# Patient Record
Sex: Female | Born: 1990 | Race: Black or African American | Hispanic: No | Marital: Single | Smoking: Never smoker
Health system: Southern US, Community
[De-identification: ages and names within clinical notes are randomized; demographics above are authoritative.]

## PROBLEM LIST (undated history)

## (undated) DIAGNOSIS — J302 Other seasonal allergic rhinitis: Secondary | ICD-10-CM

## (undated) DIAGNOSIS — S82899A Other fracture of unspecified lower leg, initial encounter for closed fracture: Secondary | ICD-10-CM

## (undated) DIAGNOSIS — B009 Herpesviral infection, unspecified: Secondary | ICD-10-CM

## (undated) HISTORY — PX: ANKLE SURGERY: SHX546

## (undated) HISTORY — PX: MOUTH SURGERY: SHX715

---
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
## 2012-09-03 ENCOUNTER — Encounter (HOSPITAL_COMMUNITY): Payer: Self-pay | Admitting: *Deleted

## 2012-09-03 ENCOUNTER — Emergency Department (INDEPENDENT_AMBULATORY_CARE_PROVIDER_SITE_OTHER)
Admission: EM | Admit: 2012-09-03 | Discharge: 2012-09-03 | Disposition: A | Discharge status: Home / Self Care | Payer: PRIVATE HEALTH INSURANCE | Source: Home / Self Care | Attending: Emergency Medicine | Admitting: Emergency Medicine

## 2012-09-03 DIAGNOSIS — N3000 Acute cystitis without hematuria: Secondary | ICD-10-CM

## 2012-09-03 LAB — POCT URINALYSIS DIP (DEVICE)
Bilirubin Urine: NEGATIVE
Glucose, UA: NEGATIVE mg/dL

## 2012-09-03 LAB — POCT PREGNANCY, URINE: Preg Test, Ur: NEGATIVE

## 2012-09-03 MED ORDER — CEPHALEXIN 500 MG PO CAPS: 500.0000 mg | ORAL_CAPSULE | Freq: Three times a day (TID) | ORAL | Status: DC

## 2012-09-03 MED ORDER — PHENAZOPYRIDINE HCL 200 MG PO TABS: 200.0000 mg | ORAL_TABLET | Freq: Three times a day (TID) | ORAL | Status: DC | PRN

## 2012-09-03 NOTE — ED Provider Notes (Signed)
Chief Complaint  Patient presents with  . Urinary Frequency    History of Present Illness:   The patient is a 21 year old female who has had a two-day history of urinary frequency, urgency, hematuria, and lower back pain. She denies dysuria, incontinence, fever, chills, nausea, vomiting, abdominal pain, or GYN complaints. No prior history of urinary tract infections.  Review of Systems:  Other than noted above, the patient denies any of the following symptoms: General:  No fevers, chills, sweats, aches, or fatigue. GI:  No abdominal pain, back pain, nausea, vomiting, diarrhea, or constipation. GU:  No dysuria, frequency, urgency, hematuria, or incontinence. GYN:  No discharge, itching, vulvar pain or lesions, pelvic pain, or abnormal vaginal bleeding.  PMFSH:  Past medical history, family history, social history, meds, and allergies were reviewed.  Physical Exam:   Vital signs:  LMP 08/07/2012 Gen:  Alert, oriented, in no distress. Lungs:  Clear to auscultation, no wheezes, rales or rhonchi. Heart:  Regular rhythm, no gallop or murmer. Abdomen:  Flat and soft. There was slight suprapubic pain to palpation.  No guarding, or rebound.  No hepato-splenomegaly or mass.  Bowel sounds were normally active.  No hernia. Back:  No CVA tenderness.  Skin:  Clear, warm and dry.  Labs:   Results for orders placed during the hospital encounter of 09/03/12  POCT URINALYSIS DIP (DEVICE)      Component Value Range   Glucose, UA NEGATIVE  NEGATIVE mg/dL   Bilirubin Urine NEGATIVE  NEGATIVE   Ketones, ur TRACE (*) NEGATIVE mg/dL   Specific Gravity, Urine 1.025  1.005 - 1.030   Hgb urine dipstick LARGE (*) NEGATIVE   pH 5.5  5.0 - 8.0   Protein, ur >=300 (*) NEGATIVE mg/dL   Urobilinogen, UA 0.2  0.0 - 1.0 mg/dL   Nitrite POSITIVE (*) NEGATIVE   Leukocytes, UA SMALL (*) NEGATIVE  POCT PREGNANCY, URINE      Component Value Range   Preg Test, Ur NEGATIVE  NEGATIVE     Other Labs Obtained at  Urgent Care Center:  A urine culture was obtained.  Results are pending at this time and we will call about any positive results.  Assessment: The encounter diagnosis was Acute cystitis.   Plan:   1.  The following meds were prescribed:   New Prescriptions   CEPHALEXIN (KEFLEX) 500 MG CAPSULE    Take 1 capsule (500 mg total) by mouth 3 (three) times daily.   PHENAZOPYRIDINE (PYRIDIUM) 200 MG TABLET    Take 1 tablet (200 mg total) by mouth 3 (three) times daily as needed for pain.   2.  The patient was instructed in symptomatic care and handouts were given. 3.  The patient was told to return if becoming worse in any way, if no better in 3 or 4 days, and given some red flag symptoms that would indicate earlier return. 4.  The patient was told to avoid intercourse for 10 days, get extra fluids, and return for a follow up with her primary care doctor at the completion of treatment for a repeat UA and culture.     Reuben Likes, MD 09/03/12 [phone]

## 2012-09-03 NOTE — ED Notes (Signed)
Pt  Reports  Symptoms  Of  Urinary  Frequency  With  Scanty  Output  As  Well  As  Hematuria    -         Since  Last  Pm  She  denys  Any  Vaginal   Discharge

## 2012-12-23 ENCOUNTER — Encounter (HOSPITAL_COMMUNITY): Payer: Self-pay | Admitting: Adult Health

## 2012-12-23 ENCOUNTER — Emergency Department (HOSPITAL_COMMUNITY)
Admission: EM | Admit: 2012-12-23 | Discharge: 2012-12-24 | Disposition: A | Discharge status: Home / Self Care | Payer: Medicaid - Out of State | Attending: Emergency Medicine | Admitting: Emergency Medicine

## 2012-12-23 DIAGNOSIS — R112 Nausea with vomiting, unspecified: Secondary | ICD-10-CM | POA: Insufficient documentation

## 2012-12-23 DIAGNOSIS — R0789 Other chest pain: Secondary | ICD-10-CM | POA: Insufficient documentation

## 2012-12-23 DIAGNOSIS — R0602 Shortness of breath: Secondary | ICD-10-CM | POA: Insufficient documentation

## 2012-12-23 HISTORY — DX: Herpesviral infection, unspecified: B00.9

## 2012-12-23 LAB — COMPREHENSIVE METABOLIC PANEL
ALT: 9 U/L (ref 0–35)
Albumin: 4.6 g/dL (ref 3.5–5.2)
Alkaline Phosphatase: 42 U/L (ref 39–117)
BUN: 7 mg/dL (ref 6–23)
Chloride: 98 mEq/L (ref 96–112)
GFR calc Af Amer: >90 mL/min (ref >90)
Glucose, Bld: 117 mg/dL — ABNORMAL HIGH (ref 70–99)
Potassium: 3.4 mEq/L — ABNORMAL LOW (ref 3.5–5.1)
Total Bilirubin: 1.3 mg/dL — ABNORMAL HIGH (ref 0.3–1.2)

## 2012-12-23 LAB — CBC WITH DIFFERENTIAL/PLATELET
Hemoglobin: 15.5 g/dL — ABNORMAL HIGH (ref 12.0–15.0)
Lymphs Abs: 0.6 10*3/uL — ABNORMAL LOW (ref 0.7–4.0)
Monocytes Relative: 2 % — ABNORMAL LOW (ref 3–12)
Neutro Abs: 17.3 10*3/uL — ABNORMAL HIGH (ref 1.7–7.7)
Neutrophils Relative %: 95 % — ABNORMAL HIGH (ref 43–77)
RBC: 4.88 MIL/uL (ref 3.87–5.11)
WBC: 18.3 10*3/uL — ABNORMAL HIGH (ref 4.0–10.5)

## 2012-12-23 MED ORDER — ONDANSETRON 4 MG PO TBDP
8.0000 mg | ORAL_TABLET | Freq: Once | ORAL | Status: AC
  Administered 2012-12-23: 8 mg via ORAL

## 2012-12-23 MED ORDER — ONDANSETRON 4 MG PO TBDP
ORAL_TABLET | ORAL | Status: AC
  Filled 2012-12-23: qty 2

## 2012-12-23 MED ORDER — ONDANSETRON HCL 4 MG/2ML IJ SOLN
4.0000 mg | Freq: Once | INTRAMUSCULAR | Status: AC
  Administered 2012-12-23: 4 mg via INTRAVENOUS
  Filled 2012-12-23: qty 2

## 2012-12-23 MED ORDER — SODIUM CHLORIDE 0.9 % IV BOLUS (SEPSIS)
1000.0000 mL | Freq: Once | INTRAVENOUS | Status: AC
Start: 2012-12-23 — End: 2012-12-24
  Administered 2012-12-23: 1000 mL via INTRAVENOUS

## 2012-12-23 MED ORDER — ONDANSETRON 4 MG PO TBDP
4.0000 mg | ORAL_TABLET | Freq: Once | ORAL | Status: AC
  Administered 2012-12-23: 4 mg via ORAL
  Filled 2012-12-23: qty 1

## 2012-12-23 NOTE — ED Provider Notes (Signed)
History     CSN: [id number]  Arrival date & time 12/23/12  9147   First MD Initiated Contact with Patient 12/23/12 2041      Chief Complaint  Patient presents with  . Nausea    (Consider location/radiation/quality/duration/timing/severity/associated sxs/prior treatment) HPI Comments: Pt presents to the ED for vomiting x 8 hours.  States she woke up around noon and began vomiting associated with intermittent chills.  Emesis was mostly non-bloody liquid but is now mainly dry heaving.  There is some intermittent SOB and chest discomfort during retching fits.  Does not recall eating anything out of ordinary.  Pt started her menstrual cycle yesterday and notes that this happens during that time but usually not this severe.  Pt denies any abdominal pain, diarrhea, dysuria, vaginal discharge, cough, or fever.  No sick exposures.  The history is provided by the patient and a friend.    Past Medical History  Diagnosis Date  . Herpes     History reviewed. No pertinent past surgical history.  History reviewed. No pertinent family history.  History  Substance Use Topics  . Smoking status: Never Smoker   . Smokeless tobacco: Not on file  . Alcohol Use: No    OB History   Grav Para Term Preterm Abortions TAB SAB Ect Mult Living                  Review of Systems  Gastrointestinal: Positive for nausea and vomiting.  All other systems reviewed and are negative.    Allergies  Review of patient's allergies indicates no known allergies.  Home Medications  No current outpatient prescriptions on file.  BP 124/65  Pulse 71  Temp(Src) 97.8 F (36.6 C) (Oral)  Resp 16  SpO2 100%  Physical Exam  Nursing note and vitals reviewed. Constitutional: She is oriented to person, place, and time. She appears well-developed and well-nourished.  HENT:  Head: Normocephalic and atraumatic.  Mouth/Throat: Uvula is midline, oropharynx is clear and moist and mucous membranes are normal. No  oropharyngeal exudate, posterior oropharyngeal edema, posterior oropharyngeal erythema or tonsillar abscesses.  Eyes: Conjunctivae and EOM are normal.  Neck: Normal range of motion. Neck supple.  Cardiovascular: Normal rate, regular rhythm and normal heart sounds.   Pulmonary/Chest: Effort normal and breath sounds normal. She has no wheezes. She has no rhonchi.  Abdominal: Soft. Bowel sounds are normal. She exhibits no distension. There is no tenderness. There is no guarding, no CVA tenderness, no tenderness at McBurney's point and negative Murphy's sign.  Musculoskeletal: Normal range of motion.  Neurological: She is alert and oriented to person, place, and time.  Skin: Skin is warm and dry.  Psychiatric: She has a normal mood and affect.    ED Course  Procedures (including critical care time)  Labs Reviewed  CBC WITH DIFFERENTIAL - Abnormal; Notable for the following:    WBC 18.3 (*)    Hemoglobin 15.5 (*)    MCHC 36.4 (*)    Neutrophils Relative 95 (*)    Neutro Abs 17.3 (*)    Lymphocytes Relative 4 (*)    Lymphs Abs 0.6 (*)    Monocytes Relative 2 (*)    All other components within normal limits  COMPREHENSIVE METABOLIC PANEL - Abnormal; Notable for the following:    Potassium 3.4 (*)    Glucose, Bld 117 (*)    Total Bilirubin 1.3 (*)    All other components within normal limits  URINALYSIS, ROUTINE W REFLEX MICROSCOPIC - Abnormal;  Notable for the following:    APPearance CLOUDY (*)    pH 8.5 (*)    Hgb urine dipstick SMALL (*)    Protein, ur 100 (*)    All other components within normal limits  URINE MICROSCOPIC-ADD ON - Abnormal; Notable for the following:    Squamous Epithelial / LPF FEW (*)    All other components within normal limits  URINE CULTURE  LIPASE, BLOOD  POCT PREGNANCY, URINE   No results found.   1. Nausea & vomiting       MDM   22 y.o. Female presenting to the ED for nausea and vomiting x 8 hours.  States she woke up around noon and began  vomiting associated with intermittent chills.  Emesis was mostly non-bloody liquid but is now mainly dry heaving.  Pt has hx of these symptoms around the time of her menses, which began yesterday, 3/17.  u-preg negative.  Urine culture pending.    Leukocytosis at 18.3 but pt is without any abdominal pain- will defer CT at this time.  Pt tolerating PO fluids and crackers without difficulty prior to d/c.  Rx zofran PRN nausea.  Return precautions advised.       Garlon Hatchet, PA-C 12/24/12 [phone]

## 2012-12-23 NOTE — ED Notes (Signed)
Pt st's she is nauseated and vomited.  Zofran ODT given for same

## 2012-12-23 NOTE — ED Notes (Signed)
Pt st's she feels better after fluids and zofran

## 2012-12-23 NOTE — ED Notes (Signed)
Resents with onset of nausea, abdominal pain and vomiting that began at 12pm today. Currently menstrating, abdominal pain is generalized. Reports 20 times of emesis, denies diarhhea. C/o SOB, chest pain after vomiting.

## 2012-12-23 NOTE — ED Notes (Signed)
Pt st's she has had nausea and vomiting since this am.  Pt denies diarrhea.

## 2012-12-24 LAB — URINALYSIS, ROUTINE W REFLEX MICROSCOPIC
Bilirubin Urine: NEGATIVE
Glucose, UA: NEGATIVE mg/dL
Ketones, ur: NEGATIVE mg/dL
Leukocytes, UA: NEGATIVE
Protein, ur: 100 mg/dL — AB

## 2012-12-24 LAB — URINE MICROSCOPIC-ADD ON

## 2012-12-24 MED ORDER — ONDANSETRON 4 MG PO TBDP: 4.0000 mg | ORAL_TABLET | Freq: Three times a day (TID) | ORAL | Status: DC | PRN

## 2012-12-25 LAB — URINE CULTURE: Colony Count: 85000

## 2012-12-25 NOTE — ED Provider Notes (Signed)
Medical screening examination/treatment/procedure(s) were performed by non-physician practitioner and as supervising physician I was immediately available for consultation/collaboration. Patient with nausea vomiting. She does have leukocytosis. She is nontender. She's tolerated orals and feels better and will be discharged home with return precautions  Juliet Rude. Rubin Payor, MD 12/25/12 0000

## 2013-03-10 ENCOUNTER — Encounter (HOSPITAL_COMMUNITY): Payer: Self-pay | Admitting: *Deleted

## 2013-03-10 ENCOUNTER — Emergency Department (HOSPITAL_COMMUNITY)
Admission: EM | Admit: 2013-03-10 | Discharge: 2013-03-11 | Disposition: A | Discharge status: Home / Self Care | Payer: Medicaid - Out of State | Attending: Emergency Medicine | Admitting: Emergency Medicine

## 2013-03-10 DIAGNOSIS — R1032 Left lower quadrant pain: Secondary | ICD-10-CM | POA: Insufficient documentation

## 2013-03-10 DIAGNOSIS — N949 Unspecified condition associated with female genital organs and menstrual cycle: Secondary | ICD-10-CM | POA: Insufficient documentation

## 2013-03-10 DIAGNOSIS — R112 Nausea with vomiting, unspecified: Secondary | ICD-10-CM

## 2013-03-10 DIAGNOSIS — R0602 Shortness of breath: Secondary | ICD-10-CM | POA: Insufficient documentation

## 2013-03-10 DIAGNOSIS — N898 Other specified noninflammatory disorders of vagina: Secondary | ICD-10-CM | POA: Insufficient documentation

## 2013-03-10 DIAGNOSIS — M545 Low back pain, unspecified: Secondary | ICD-10-CM | POA: Insufficient documentation

## 2013-03-10 DIAGNOSIS — Z8619 Personal history of other infectious and parasitic diseases: Secondary | ICD-10-CM | POA: Insufficient documentation

## 2013-03-10 DIAGNOSIS — R109 Unspecified abdominal pain: Secondary | ICD-10-CM

## 2013-03-10 DIAGNOSIS — R0789 Other chest pain: Secondary | ICD-10-CM | POA: Insufficient documentation

## 2013-03-10 NOTE — ED Notes (Signed)
Pt in c/o LLQ abd pain with n/v x2 hours

## 2013-03-11 ENCOUNTER — Emergency Department (HOSPITAL_COMMUNITY): Payer: Medicaid - Out of State

## 2013-03-11 LAB — COMPREHENSIVE METABOLIC PANEL
ALT: 6 U/L (ref 0–35)
AST: 21 U/L (ref 0–37)
Albumin: 4.9 g/dL (ref 3.5–5.2)
Alkaline Phosphatase: 42 U/L (ref 39–117)
BUN: 9 mg/dL (ref 6–23)
CO2: 25 mEq/L (ref 19–32)
Calcium: 10.6 mg/dL — ABNORMAL HIGH (ref 8.4–10.5)
Chloride: 94 mEq/L — ABNORMAL LOW (ref 96–112)
Creatinine, Ser: 0.73 mg/dL (ref 0.50–1.10)
GFR calc Af Amer: >90 mL/min (ref >90)
GFR calc non Af Amer: >90 mL/min (ref >90)
Glucose, Bld: 142 mg/dL — ABNORMAL HIGH (ref 70–99)
Potassium: 4.2 mEq/L (ref 3.5–5.1)
Sodium: 135 mEq/L (ref 135–145)
Total Bilirubin: 1.3 mg/dL — ABNORMAL HIGH (ref 0.3–1.2)
Total Protein: 8.7 g/dL — ABNORMAL HIGH (ref 6.0–8.3)

## 2013-03-11 LAB — CBC WITH DIFFERENTIAL/PLATELET
Basophils Absolute: 0 10*3/uL (ref 0.0–0.1)
Basophils Relative: 0 % (ref 0–1)
Eosinophils Absolute: 0 10*3/uL (ref 0.0–0.7)
Eosinophils Relative: 0 % (ref 0–5)
HCT: 44.4 % (ref 36.0–46.0)
Hemoglobin: 15.2 g/dL — ABNORMAL HIGH (ref 12.0–15.0)
Lymphocytes Relative: 6 % — ABNORMAL LOW (ref 12–46)
Lymphs Abs: 0.9 10*3/uL (ref 0.7–4.0)
MCH: 30.2 pg (ref 26.0–34.0)
MCHC: 34.2 g/dL (ref 30.0–36.0)
MCV: 88.1 fL (ref 78.0–100.0)
Monocytes Absolute: 0.3 10*3/uL (ref 0.1–1.0)
Monocytes Relative: 2 % — ABNORMAL LOW (ref 3–12)
Neutro Abs: 14.6 10*3/uL — ABNORMAL HIGH (ref 1.7–7.7)
Neutrophils Relative %: 92 % — ABNORMAL HIGH (ref 43–77)
Platelets: 195 10*3/uL (ref 150–400)
RBC: 5.04 MIL/uL (ref 3.87–5.11)
RDW: 11.8 % (ref 11.5–15.5)
WBC: 15.8 10*3/uL — ABNORMAL HIGH (ref 4.0–10.5)

## 2013-03-11 LAB — WET PREP, GENITAL
Clue Cells Wet Prep HPF POC: NONE SEEN
Trich, Wet Prep: NONE SEEN

## 2013-03-11 LAB — URINE MICROSCOPIC-ADD ON

## 2013-03-11 LAB — URINALYSIS, ROUTINE W REFLEX MICROSCOPIC
Bilirubin Urine: NEGATIVE
Hgb urine dipstick: NEGATIVE
Ketones, ur: 40 mg/dL — AB
Specific Gravity, Urine: 1.025 (ref 1.005–1.030)
pH: 8.5 — ABNORMAL HIGH (ref 5.0–8.0)

## 2013-03-11 LAB — GC/CHLAMYDIA PROBE AMP
CT Probe RNA: NEGATIVE
GC Probe RNA: NEGATIVE

## 2013-03-11 LAB — POCT I-STAT TROPONIN I: Troponin i, poc: 0 ng/mL (ref 0.00–0.08)

## 2013-03-11 LAB — RAPID URINE DRUG SCREEN, HOSP PERFORMED: Barbiturates: NOT DETECTED

## 2013-03-11 MED ORDER — ONDANSETRON HCL 4 MG/2ML IJ SOLN
4.0000 mg | Freq: Once | INTRAMUSCULAR | Status: AC
  Administered 2013-03-11: 4 mg via INTRAVENOUS
  Filled 2013-03-11: qty 2

## 2013-03-11 MED ORDER — SODIUM CHLORIDE 0.9 % IV BOLUS (SEPSIS)
1000.0000 mL | Freq: Once | INTRAVENOUS | Status: AC
  Administered 2013-03-11: 1000 mL via INTRAVENOUS

## 2013-03-11 MED ORDER — LORAZEPAM 1 MG PO TABS
1.0000 mg | ORAL_TABLET | Freq: Once | ORAL | Status: AC
  Administered 2013-03-11: 1 mg via ORAL
  Filled 2013-03-11: qty 1

## 2013-03-11 MED ORDER — LORAZEPAM 2 MG/ML IJ SOLN
0.5000 mg | Freq: Once | INTRAMUSCULAR | Status: AC
  Administered 2013-03-11: 0.5 mg via INTRAVENOUS
  Filled 2013-03-11: qty 1

## 2013-03-11 NOTE — ED Provider Notes (Signed)
History     CSN: [id number]  Arrival date & time 03/10/13  2215   First MD Initiated Contact with Patient 03/11/13 0013      Chief Complaint  Patient presents with  . Abdominal Pain    (Consider location/radiation/quality/duration/timing/severity/associated sxs/prior treatment) The history is provided by the patient and a friend. No language interpreter was used.  Meagan Anderson is a 22 y/o F with PMHx of Herpes presenting to the ED, with girlfriend, with sudden onset of LLQ pain that started at approximately 6:30PM while her and her partner were having sexual interactions, described as a sharp, shooting discomfort with radiation to the lower back, constant. Patient reported that she is experiencing lower back pain described as a constant cramping sensation. Reported that she has been feeling nauseous ever since 6:30PM - reported that she vomited at least 10 times today, mainly of food first and then liquids. Patient reported that she has been experiencing chest pain, described as a burning sensation to the center of her chest that has been ongoing starting since she has been vomiting, reported that she finds it difficult to breath. Reported that she immediately came to the ED with the onset of the LLQ pain. Associated symptoms are chills. Denied vaginal pain, vaginal discharge, fever, headache, dizziness, blurred vision, neck pain, palpitations.      Last outbreak of genital herpes was last week - denied being on medication - stated that she gets outbreaks at least once per month - denied follow-up.   Past Medical History  Diagnosis Date  . Herpes     History reviewed. No pertinent past surgical history.  History reviewed. No pertinent family history.  History  Substance Use Topics  . Smoking status: Never Smoker   . Smokeless tobacco: Not on file  . Alcohol Use: No    OB History   Grav Para Term Preterm Abortions TAB SAB Ect Mult Living                  Review of Systems    Constitutional: Negative for fever, chills and fatigue.  HENT: Negative for sore throat, trouble swallowing, neck pain and neck stiffness.   Eyes: Negative for photophobia, pain and visual disturbance.  Respiratory: Positive for chest tightness and shortness of breath. Negative for cough.   Cardiovascular: Negative for chest pain.  Gastrointestinal: Positive for nausea, vomiting and abdominal pain. Negative for diarrhea, constipation, blood in stool and anal bleeding.  Genitourinary: Positive for pelvic pain. Negative for dysuria, decreased urine volume, vaginal bleeding, vaginal discharge, difficulty urinating and vaginal pain.  Musculoskeletal: Negative for back pain and arthralgias.  Skin: Negative for rash and wound.  Neurological: Negative for dizziness, weakness, light-headedness, numbness and headaches.  All other systems reviewed and are negative.    Allergies  Review of patient's allergies indicates no known allergies.  Home Medications  No current outpatient prescriptions on file.  BP 122/74  Pulse 70  Temp(Src) 98 F (36.7 C) (Oral)  Resp 20  Ht 5\' 6"  (1.676 m)  Wt 130 lb (58.968 kg)  BMI 20.99 kg/m2  SpO2 100%  Physical Exam  Nursing note and vitals reviewed. Constitutional: She is oriented to person, place, and time. She appears well-developed and well-nourished. No distress.  HENT:  Head: Normocephalic and atraumatic.  Eyes: Conjunctivae and EOM are normal. Pupils are equal, round, and reactive to light. Right eye exhibits no discharge. Left eye exhibits no discharge.  Neck: Normal range of motion. Neck supple. No tracheal  deviation present.  Cardiovascular: Normal rate, regular rhythm and normal heart sounds.  Exam reveals no friction rub.   No murmur heard. Pulses:      Radial pulses are 2+ on the right side, and 2+ on the left side.  Pulmonary/Chest: Effort normal and breath sounds normal. No respiratory distress. She has no wheezes. She has no rales. She  exhibits no tenderness.  Negative chest wall tenderness  Abdominal: Soft. Bowel sounds are normal. She exhibits no distension. There is tenderness. There is no rebound and no guarding.    Genitourinary: Vaginal discharge found.  Speculum: Negative lesions, sores, inflammation, swelling, erythema, noted to external genitalia. Negative bleeding in vaginal vault. Thick white discharge noted from the cervical os. Negative inflammation, swelling, lesions, sores noted to the vaginal canal. Negative inflammation, swelling, "strawberry" appearance to the cervix.  Pelvic: Negative masses, lesions, sores palpated. Negative CMT. Mild discomfort on the left adnexa.   Lymphadenopathy:    She has no cervical adenopathy.  Neurological: She is alert and oriented to person, place, and time. No cranial nerve deficit. She exhibits normal muscle tone. Coordination normal.  Skin: Skin is warm and dry. No rash noted. She is not diaphoretic. No erythema.  Psychiatric: She has a normal mood and affect. Her behavior is normal. Thought content normal.    ED Course  Procedures (including critical care time)  Labs Reviewed  CBC WITH DIFFERENTIAL - Abnormal; Notable for the following:    WBC 15.8 (*)    Hemoglobin 15.2 (*)    Neutrophils Relative % 92 (*)    Neutro Abs 14.6 (*)    Lymphocytes Relative 6 (*)    Monocytes Relative 2 (*)    All other components within normal limits  COMPREHENSIVE METABOLIC PANEL - Abnormal; Notable for the following:    Chloride 94 (*)    Glucose, Bld 142 (*)    Calcium 10.6 (*)    Total Protein 8.7 (*)    Total Bilirubin 1.3 (*)    All other components within normal limits   No results found.   No diagnosis found.    MDM  Blood-work and urine analysis ordered. Wet prep and GC/Chlamydia probe ordered. US abdomen ordered to rule out possible ovarian cyst. Possible CT scan of abdomen to find source of abdominal discomfort if [place] negative. Patient given IV NS fluids and  antiemetics for nausea and vomiting control. Control patient's nausea and vomiting. Control chest pain of patient.  Discussed case with Dr. Marylen Ponto and Earley Favor, NP - transfer of care to Earley Favor, NP at 2:00AM.         Raymon Mutton, PA-C 03/11/13 [phone]

## 2013-03-11 NOTE — ED Notes (Signed)
Patient given a cup and is aware that we need a sample

## 2013-03-11 NOTE — ED Provider Notes (Signed)
  Physical Exam  BP 117/60  Pulse 74  Temp(Src) 98.2 F (36.8 C) (Oral)  Resp 18  Ht 5\' 6"  (1.676 m)  Wt 130 lb (58.968 kg)  BMI 20.99 kg/m2  SpO2 100%  Physical Exam Patient is awaiting ultrasound, and Ativan for symptom control ED Course  Procedures  MDM She's had no episodes of nausea, or vomiting.  In the past 2 hours since being given Ativan, abdominal pain, has since resolved.  Her ultrasound has been reviewed and is negative.  She will be discharged him      Arman Filter, NP 03/11/13 [phone]

## 2013-03-12 NOTE — ED Provider Notes (Signed)
Medical screening examination/treatment/procedure(s) were performed by non-physician practitioner and as supervising physician I was immediately available for consultation/collaboration.  Raeford Razor, MD 03/12/13 [phone]

## 2013-03-12 NOTE — ED Provider Notes (Signed)
Medical screening examination/treatment/procedure(s) were performed by non-physician practitioner and as supervising physician I was immediately available for consultation/collaboration.  Raeford Razor, MD 03/12/13 [phone]

## 2013-10-20 ENCOUNTER — Emergency Department (INDEPENDENT_AMBULATORY_CARE_PROVIDER_SITE_OTHER)
Admission: EM | Admit: 2013-10-20 | Discharge: 2013-10-20 | Disposition: A | Discharge status: Home / Self Care | Payer: Self-pay | Source: Home / Self Care | Attending: Family Medicine | Admitting: Family Medicine

## 2013-10-20 ENCOUNTER — Encounter (HOSPITAL_COMMUNITY): Payer: Self-pay | Admitting: Emergency Medicine

## 2013-10-20 DIAGNOSIS — M545 Low back pain, unspecified: Secondary | ICD-10-CM

## 2013-10-20 HISTORY — DX: Other seasonal allergic rhinitis: J30.2

## 2013-10-20 MED ORDER — CYCLOBENZAPRINE HCL 5 MG PO TABS: 5.0000 mg | ORAL_TABLET | Freq: Every evening | ORAL | Status: DC | PRN

## 2013-10-20 MED ORDER — DICLOFENAC SODIUM 50 MG PO TBEC: 50.0000 mg | DELAYED_RELEASE_TABLET | Freq: Two times a day (BID) | ORAL | Status: DC | PRN

## 2013-10-20 MED ORDER — TRAMADOL HCL 50 MG PO TABS: 50.0000 mg | ORAL_TABLET | Freq: Four times a day (QID) | ORAL | Status: DC | PRN

## 2013-10-20 NOTE — ED Provider Notes (Signed)
Meagan Anderson is a 23 y.o. female who presents to Urgent Care today for left low back pain radiating to the left knee for the last 2-3 days. This occurred without injury. Pain is moderate to severe and is worse with activity. No weakness numbness bowel bladder dysfunction or difficulty walking. Patient has tried a heating pad which did not help much. She has not tried any medications yet. No fevers or chills nausea vomiting or diarrhea. She has never had a like this happen before.   Past Medical History  Diagnosis Date  . Herpes   . Seasonal allergies    History  Substance Use Topics  . Smoking status: Never Smoker   . Smokeless tobacco: Not on file  . Alcohol Use: No   ROS as above Medications reviewed. No current facility-administered medications for this encounter.   Current Outpatient Prescriptions  Medication Sig Dispense Refill  . cyclobenzaprine (FLEXERIL) 5 MG tablet Take 1 tablet (5 mg total) by mouth at bedtime as needed for muscle spasms.  20 tablet  0  . diclofenac (VOLTAREN) 50 MG EC tablet Take 1 tablet (50 mg total) by mouth 2 (two) times daily as needed.  60 tablet  0  . traMADol (ULTRAM) 50 MG tablet Take 1 tablet (50 mg total) by mouth every 6 (six) hours as needed.  15 tablet  0    Exam:  BP 138/86  Pulse 82  Temp(Src) 98.8 F (37.1 C) (Oral)  Resp 16  SpO2 100%  LMP 09/18/2013 Gen: Well NAD BACK: Nontender to spinal midline. Tender to palpation left lumbar paraspinal and SI joint. Negative straight leg raise test and negative Faber test bilaterally. Hip motion is intact bilaterally. Sensation is intact throughout bilateral lower extremities. Strength is intact throughout bilaterally extremities. Patient is in her toes heels squat get on and off exam table and has a normal gait. Reflexes are equal and intact bilateral knees and ankles.   Assessment and Plan: 23 y.o. female with lumbago due to myofascial injury of the left low back. Do not think patient  has sciatica as the pain does not radiate below the knee. Plan to treat with diclofenac Flexeril and tramadol. Additionally use a heating pad and home exercise program. Work note provided.  Discussed warning signs or symptoms. Please see discharge instructions. Patient expresses understanding.    Rodolph BongEvan S Ewel Lona, MD 10/20/13 Ernestina [id number]

## 2013-10-20 NOTE — ED Notes (Signed)
C/o low back pain onset Sunday.  No known injury.  Pain sometimes radiates up her back or down her L leg. Unable to bend or twist.  After sitting, her back feels locked when she gets up and then can walk. Pain going up hills or curb causes the pain.

## 2013-10-20 NOTE — Discharge Instructions (Signed)
Thank you for coming in today. Take Diclofenac twice daily. Use Flexeril at bedtime as needed. Use tramadol for severe pain as needed. Use a heating pad. Come back or go to the emergency room if you notice new weakness new numbness problems walking or bowel or bladder problems.

## 2014-10-08 NOTE — L&D Delivery Note (Cosign Needed)
Delivery Note Meagan Anderson, CNM came to [facility] when pt could be heard screaming and pushing involuntarily. Vertex was 10/10/+3. At 1306 a viable and healthy female was delivered precipitously via  (Presentation:LOA w/ right compound arm).  APGAR: 8, 9; weight pending.  Meagan Anderson, CNM arrived after delivery of baby and took over care of pt.   Meagan Anderson 07/06/2015, 1:21 PM   Delivery Note Infant on maternal abdomen after delivery.  Delayed cord clamping for 3-5 minutes.  Cord double clamped and cut.  Cord blood labs sent.    Placenta status: spontaneous, schultz, intact; 3 vessel cord.  Cord pH: N/A.  Uterus firm, bleeding stable.    Anesthesia:  none Episiotomy:  None Lacerations:  None Suture Repair: None Est. Blood Loss (mL):  5 ml  Mom to postpartum.  Baby to Couplet care / Skin to Skin.  Roe Coombs, CNM 07/06/2015, 1:32 PM

## 2015-01-24 ENCOUNTER — Telehealth: Payer: Self-pay

## 2015-01-24 NOTE — Telephone Encounter (Signed)
Left message regarding patient's pmt plan on 01/24/15 - see notes in guarantor snapshot and message note in appt date - has appt 02/01/15

## 2015-02-01 ENCOUNTER — Encounter: Payer: Self-pay | Admitting: Certified Nurse Midwife

## 2015-02-01 ENCOUNTER — Ambulatory Visit (INDEPENDENT_AMBULATORY_CARE_PROVIDER_SITE_OTHER): Payer: Managed Care, Other (non HMO) | Admitting: Certified Nurse Midwife

## 2015-02-01 VITALS — BP 137/75 | HR 90 | Temp 98.9°F | Wt 133.0 lb

## 2015-02-01 DIAGNOSIS — Z3402 Encounter for supervision of normal first pregnancy, second trimester: Secondary | ICD-10-CM

## 2015-02-01 DIAGNOSIS — O2690 Pregnancy related conditions, unspecified, unspecified trimester: Secondary | ICD-10-CM | POA: Diagnosis not present

## 2015-02-01 DIAGNOSIS — Z8619 Personal history of other infectious and parasitic diseases: Secondary | ICD-10-CM

## 2015-02-01 LAB — POCT URINALYSIS DIPSTICK
GLUCOSE UA: NEGATIVE
KETONES UA: 2
Leukocytes, UA: NEGATIVE
Nitrite, UA: NEGATIVE
PH UA: 7
RBC UA: NEGATIVE
SPEC GRAV UA: 1.015

## 2015-02-01 LAB — TSH: TSH: 1.128 u[IU]/mL (ref 0.350–4.500)

## 2015-02-01 MED ORDER — OB COMPLETE GOLD 27.5-1-200 MG PO CAPS: 1.0000 | ORAL_CAPSULE | Freq: Every day | ORAL | Status: DC
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
## 2015-02-01 NOTE — Progress Notes (Signed)
Patient is experiencing her first pregnancy.

## 2015-02-02 LAB — OBSTETRIC PANEL
ANTIBODY SCREEN: NEGATIVE
BASOS ABS: 0 10*3/uL (ref 0.0–0.1)
Basophils Relative: 0 % (ref 0–1)
Eosinophils Absolute: 0 10*3/uL (ref 0.0–0.7)
Eosinophils Relative: 0 % (ref 0–5)
HEMATOCRIT: 41.7 % (ref 36.0–46.0)
Hemoglobin: 14.2 g/dL (ref 12.0–15.0)
Hepatitis B Surface Ag: NEGATIVE
Lymphocytes Relative: 14 % (ref 12–46)
Lymphs Abs: 1.8 10*3/uL (ref 0.7–4.0)
MCH: 31.9 pg (ref 26.0–34.0)
MCHC: 34.1 g/dL (ref 30.0–36.0)
MCV: 93.7 fL (ref 78.0–100.0)
MONO ABS: 0.5 10*3/uL (ref 0.1–1.0)
MONOS PCT: 4 % (ref 3–12)
MPV: 11.5 fL (ref 8.6–12.4)
NEUTROS ABS: 10.4 10*3/uL — AB (ref 1.7–7.7)
NEUTROS PCT: 82 % — AB (ref 43–77)
Platelets: 193 10*3/uL (ref 150–400)
RBC: 4.45 MIL/uL (ref 3.87–5.11)
RDW: 13.4 % (ref 11.5–15.5)
Rh Type: NEGATIVE
Rubella: 1.06 Index — ABNORMAL HIGH (ref <0.90)
WBC: 12.7 10*3/uL — AB (ref 4.0–10.5)

## 2015-02-02 LAB — HIV ANTIBODY (ROUTINE TESTING W REFLEX): HIV: NONREACTIVE

## 2015-02-02 LAB — VITAMIN D 25 HYDROXY (VIT D DEFICIENCY, FRACTURES): VIT D 25 HYDROXY: 18 ng/mL — AB (ref 30–100)

## 2015-02-02 LAB — PAP IG W/ RFLX HPV ASCU

## 2015-02-02 LAB — VARICELLA ZOSTER ANTIBODY, IGG: Varicella IgG: 1280 Index — ABNORMAL HIGH (ref <135.00)

## 2015-02-03 LAB — AFP, QUAD SCREEN
AFP: 31.3 ng/mL
CURR GEST AGE: 15.5 wks.days
Down Syndrome Scr Risk Est: 1:3940 {titer}
HCG, Total: 27.26 IU/mL
INH: 295 pg/mL
INTERPRETATION-AFP: NEGATIVE
MOM FOR HCG: 0.54
MoM for AFP: 0.8
MoM for INH: 1.58
Open Spina bifida: NEGATIVE
TRI 18 SCR RISK EST: NEGATIVE
UE3 VALUE: 0.8 ng/mL
uE3 Mom: 1.01

## 2015-02-03 LAB — HEMOGLOBINOPATHY EVALUATION
HEMOGLOBIN OTHER: 0 %
Hgb A2 Quant: 2.8 % (ref 2.2–3.2)
Hgb A: 97 % (ref 96.8–97.8)
Hgb F Quant: 0.2 % (ref 0.0–2.0)
Hgb S Quant: 0 %

## 2015-02-03 LAB — CULTURE, OB URINE
Colony Count: NO GROWTH
ORGANISM ID, BACTERIA: NO GROWTH

## 2015-02-04 ENCOUNTER — Other Ambulatory Visit: Payer: Self-pay | Admitting: Certified Nurse Midwife

## 2015-02-04 LAB — SURESWAB, VAGINOSIS/VAGINITIS PLUS
Atopobium vaginae: 7.2 Log (cells/mL)
BV CATEGORY: UNDETERMINED — AB
C. GLABRATA, DNA: NOT DETECTED
C. TRACHOMATIS RNA, TMA: NOT DETECTED
C. albicans, DNA: NOT DETECTED
C. parapsilosis, DNA: NOT DETECTED
C. tropicalis, DNA: NOT DETECTED
LACTOBACILLUS SPECIES: 7.9 Log (cells/mL)
MEGASPHAERA SPECIES: >8 Log (cells/mL)
N. gonorrhoeae RNA, TMA: NOT DETECTED
T. VAGINALIS RNA, QL TMA: NOT DETECTED

## 2015-03-01 ENCOUNTER — Encounter: Payer: Managed Care, Other (non HMO) | Admitting: Certified Nurse Midwife

## 2015-03-01 ENCOUNTER — Other Ambulatory Visit: Payer: Managed Care, Other (non HMO)

## 2015-03-03 ENCOUNTER — Ambulatory Visit (INDEPENDENT_AMBULATORY_CARE_PROVIDER_SITE_OTHER): Payer: Managed Care, Other (non HMO) | Admitting: Certified Nurse Midwife

## 2015-03-03 ENCOUNTER — Ambulatory Visit (INDEPENDENT_AMBULATORY_CARE_PROVIDER_SITE_OTHER): Payer: Managed Care, Other (non HMO)

## 2015-03-03 VITALS — BP 136/80 | HR 80 | Temp 97.9°F | Wt 140.0 lb

## 2015-03-03 DIAGNOSIS — Z3402 Encounter for supervision of normal first pregnancy, second trimester: Secondary | ICD-10-CM | POA: Diagnosis not present

## 2015-03-03 LAB — US OB COMP + 14 WK

## 2015-03-03 LAB — POCT URINALYSIS DIPSTICK
Bilirubin, UA: NEGATIVE
Glucose, UA: NEGATIVE
KETONES UA: NEGATIVE
Leukocytes, UA: NEGATIVE
NITRITE UA: NEGATIVE
PROTEIN UA: NEGATIVE
RBC UA: NEGATIVE
SPEC GRAV UA: 1.015
Urobilinogen, UA: NEGATIVE
pH, UA: 5

## 2015-03-03 NOTE — Progress Notes (Signed)
Subjective:    Meagan Anderson is a 24 y.o. female being seen today for her obstetrical visit. She is at 523w0d gestation. Patient reports: no complaints . Fetal movement: normal.  FOB present for exam today.    Problem List Items Addressed This Visit    None    Visit Diagnoses    Encounter for supervision of normal first pregnancy in second trimester    -  Primary    Relevant Orders    POCT urinalysis dipstick      Patient Active Problem List   Diagnosis Date Noted  . Hx of herpes simplex type 2 infection 02/01/2015   Objective:    BP 136/80 mmHg  Pulse 80  Temp(Src) 97.9 F (36.6 C)  Wt 140 lb (63.504 kg)  LMP 10/14/2014 FHT: 152 BPM  Uterine Size: size equals dates and at U.      Assessment:    Pregnancy @ 3123w0d    Plan:    OBGCT: discussed. Signs and symptoms of preterm labor: discussed. Had fetal anatomy scan today.   Labs, problem list reviewed and updated 2 hr GTT planned for 28 weeks.   Follow up in 4 weeks.

## 2015-04-05 ENCOUNTER — Encounter: Payer: Self-pay | Admitting: Certified Nurse Midwife

## 2015-04-05 ENCOUNTER — Ambulatory Visit (INDEPENDENT_AMBULATORY_CARE_PROVIDER_SITE_OTHER): Payer: Managed Care, Other (non HMO) | Admitting: Certified Nurse Midwife

## 2015-04-05 VITALS — BP 128/74 | HR 95 | Temp 97.7°F | Wt 143.0 lb

## 2015-04-05 DIAGNOSIS — Z3402 Encounter for supervision of normal first pregnancy, second trimester: Secondary | ICD-10-CM

## 2015-04-05 LAB — POCT URINALYSIS DIPSTICK
Bilirubin, UA: NEGATIVE
Glucose, UA: NORMAL
Ketones, UA: NEGATIVE
Leukocytes, UA: NEGATIVE
NITRITE UA: NEGATIVE
PH UA: 5
PROTEIN UA: NEGATIVE
RBC UA: NEGATIVE
Spec Grav, UA: 1.02
Urobilinogen, UA: NEGATIVE

## 2015-04-05 NOTE — Progress Notes (Signed)
Subjective:    Meagan Anderson is a 24 y.o. female being seen today for her obstetrical visit. She is at 8057w5d gestation. Patient reports: no complaints . Fetal movement: normal.  Problem List Items Addressed This Visit    None    Visit Diagnoses    Encounter for supervision of normal first pregnancy in second trimester    -  Primary    Relevant Orders    POCT urinalysis dipstick      Patient Active Problem List   Diagnosis Date Noted  . Hx of herpes simplex type 2 infection 02/01/2015   Objective:    BP 128/74 mmHg  Pulse 95  Temp(Src) 97.7 F (36.5 C)  Wt 143 lb (64.864 kg)  LMP 10/14/2014 FHT: 145 BPM  Uterine Size: size equals dates     Assessment:    Pregnancy @ 3957w5d    Plan:    OBGCT: discussed. Signs and symptoms of preterm labor: discussed.  Labs, problem list reviewed and updated 2 hr GTT planned for next visit.  Follow up in 3 weeks, 2 days with Rhogam and 2 hour OGTT.

## 2015-04-12 ENCOUNTER — Other Ambulatory Visit: Payer: Self-pay | Admitting: Certified Nurse Midwife

## 2015-04-29 ENCOUNTER — Other Ambulatory Visit: Payer: Managed Care, Other (non HMO)

## 2015-04-29 ENCOUNTER — Ambulatory Visit (INDEPENDENT_AMBULATORY_CARE_PROVIDER_SITE_OTHER): Payer: Managed Care, Other (non HMO) | Admitting: Certified Nurse Midwife

## 2015-04-29 VITALS — BP 129/70 | HR 88 | Temp 97.9°F | Wt 146.0 lb

## 2015-04-29 DIAGNOSIS — Z3483 Encounter for supervision of other normal pregnancy, third trimester: Secondary | ICD-10-CM | POA: Diagnosis not present

## 2015-04-29 DIAGNOSIS — Z2913 Encounter for prophylactic Rho(D) immune globulin: Secondary | ICD-10-CM

## 2015-04-29 DIAGNOSIS — Z418 Encounter for other procedures for purposes other than remedying health state: Secondary | ICD-10-CM

## 2015-04-29 LAB — POCT URINALYSIS DIPSTICK
Bilirubin, UA: NEGATIVE
GLUCOSE UA: NEGATIVE
Ketones, UA: NEGATIVE
Leukocytes, UA: NEGATIVE
Nitrite, UA: NEGATIVE
PH UA: 6
Protein, UA: NEGATIVE
RBC UA: NEGATIVE
Spec Grav, UA: 1.015
Urobilinogen, UA: NEGATIVE

## 2015-04-29 LAB — CBC
HEMATOCRIT: 38.7 % (ref 36.0–46.0)
Hemoglobin: 12.8 g/dL (ref 12.0–15.0)
MCH: 31.1 pg (ref 26.0–34.0)
MCHC: 33.1 g/dL (ref 30.0–36.0)
MCV: 94.2 fL (ref 78.0–100.0)
MPV: 11.6 fL (ref 8.6–12.4)
PLATELETS: 147 10*3/uL — AB (ref 150–400)
RBC: 4.11 MIL/uL (ref 3.87–5.11)
RDW: 12.4 % (ref 11.5–15.5)
WBC: 10.9 10*3/uL — ABNORMAL HIGH (ref 4.0–10.5)

## 2015-04-29 NOTE — Progress Notes (Signed)
Subjective:    Meagan Anderson is a 24 y.o. female being seen today for her obstetrical visit. She is at [redacted]w[redacted]d gestation. Patient reports no complaints. Fetal movement: normal.  Problem List Items Addressed This Visit    None    Visit Diagnoses    Supervision of other normal pregnancy, antepartum, third trimester    -  Primary    Relevant Orders    US OB Follow Up    POCT urinalysis dipstick (Completed)    Glucose Tolerance, 2 Hours w/1 Hour    CBC    HIV antibody    RPR    Need for rhogam due to Rh negative mother        Relevant Orders    AMB referral to maternal fetal medicine      Patient Active Problem List   Diagnosis Date Noted  . Hx of herpes simplex type 2 infection 02/01/2015   Objective:    BP 129/70 mmHg  Pulse 88  Temp(Src) 97.9 F (36.6 C)  Wt 146 lb (66.225 kg)  LMP 10/14/2014 FHT:  135 BPM  Uterine Size: size equals dates  Presentation: unsure     Assessment:    Pregnancy @ [redacted]w[redacted]d weeks   Patient refused Rhogam  Plan:    F/U ultrasound for sub-obtimal views with fetal anamtomy   labs reviewed, problem list updated TOC for Trich on previous sure swab 2 hour OGTT in office today  Consent signed. TDAP offered  Rhogam refused in office, MFM consult done. Patient given information about Rhogam.   Pediatrician: discussed. Infant feeding: plans to breastfeed. Maternity leave: discussed. Cigarette smoking: never smoked. Orders Placed This Encounter  Procedures  . US OB Follow Up    Standing Status: Future     Number of Occurrences:      Standing Expiration Date: 06/29/2016    Order Specific Question:  Reason for Exam (SYMPTOM  OR DIAGNOSIS REQUIRED)    Answer:  for suboptimal views on previous US    Order Specific Question:  Preferred imaging location?    Answer:  [facility]  . Glucose Tolerance, 2 Hours w/1 Hour  . CBC  . HIV antibody  . RPR  . AMB referral to maternal fetal medicine    Referral Priority:  Routine    Referral Type:   Consultation    Number of Visits Requested:  1  . POCT urinalysis dipstick   No orders of the defined types were placed in this encounter.   Follow up in 2 Weeks.

## 2015-04-30 LAB — GLUCOSE TOLERANCE, 2 HOURS W/ 1HR
GLUCOSE, 2 HOUR: 88 mg/dL (ref 70–139)
GLUCOSE, FASTING: 66 mg/dL — AB (ref 70–99)
GLUCOSE: 146 mg/dL (ref 70–170)

## 2015-04-30 LAB — RPR

## 2015-04-30 LAB — HIV ANTIBODY (ROUTINE TESTING W REFLEX): HIV: NONREACTIVE

## 2015-05-10 ENCOUNTER — Ambulatory Visit (HOSPITAL_COMMUNITY)
Admission: RE | Admit: 2015-05-10 | Discharge: 2015-05-10 | Disposition: A | Discharge status: Home / Self Care | Payer: Managed Care, Other (non HMO) | Source: Ambulatory Visit | Attending: Certified Nurse Midwife | Admitting: Certified Nurse Midwife

## 2015-05-10 ENCOUNTER — Other Ambulatory Visit: Payer: Self-pay | Admitting: Certified Nurse Midwife

## 2015-05-10 ENCOUNTER — Ambulatory Visit (HOSPITAL_COMMUNITY): Admission: RE | Admit: 2015-05-10 | Payer: Managed Care, Other (non HMO) | Source: Ambulatory Visit

## 2015-05-10 ENCOUNTER — Encounter (HOSPITAL_COMMUNITY): Payer: Self-pay

## 2015-05-10 DIAGNOSIS — Z1389 Encounter for screening for other disorder: Secondary | ICD-10-CM

## 2015-05-10 DIAGNOSIS — Z3483 Encounter for supervision of other normal pregnancy, third trimester: Secondary | ICD-10-CM

## 2015-05-10 DIAGNOSIS — O360130 Maternal care for anti-D [Rh] antibodies, third trimester, not applicable or unspecified: Secondary | ICD-10-CM

## 2015-05-10 DIAGNOSIS — Z3A29 29 weeks gestation of pregnancy: Secondary | ICD-10-CM | POA: Insufficient documentation

## 2015-05-10 DIAGNOSIS — O359XX0 Maternal care for (suspected) fetal abnormality and damage, unspecified, not applicable or unspecified: Secondary | ICD-10-CM

## 2015-05-10 DIAGNOSIS — Z363 Encounter for antenatal screening for malformations: Secondary | ICD-10-CM

## 2015-05-10 DIAGNOSIS — O26899 Other specified pregnancy related conditions, unspecified trimester: Secondary | ICD-10-CM | POA: Insufficient documentation

## 2015-05-10 DIAGNOSIS — Z6791 Unspecified blood type, Rh negative: Secondary | ICD-10-CM

## 2015-05-11 ENCOUNTER — Other Ambulatory Visit: Payer: Self-pay | Admitting: Certified Nurse Midwife

## 2015-05-12 ENCOUNTER — Encounter: Payer: Managed Care, Other (non HMO) | Admitting: Obstetrics

## 2015-05-16 ENCOUNTER — Telehealth: Payer: Self-pay | Admitting: Obstetrics

## 2015-05-16 ENCOUNTER — Encounter: Payer: Managed Care, Other (non HMO) | Admitting: Obstetrics

## 2015-05-17 ENCOUNTER — Other Ambulatory Visit: Payer: Self-pay | Admitting: Certified Nurse Midwife

## 2015-05-20 NOTE — Telephone Encounter (Signed)
05/20/2015 - no return calls from patient. brm °

## 2015-06-20 ENCOUNTER — Telehealth: Payer: Self-pay | Admitting: *Deleted

## 2015-06-20 NOTE — Telephone Encounter (Signed)
Left message on patient's phone that I have paperwork from her job that needs to be filled out- also she has not been in the office and we need to see her for a pregnancy check. Please call me back.

## 2015-06-29 ENCOUNTER — Ambulatory Visit (INDEPENDENT_AMBULATORY_CARE_PROVIDER_SITE_OTHER): Payer: Medicaid Other | Admitting: Obstetrics

## 2015-06-29 VITALS — BP 134/90 | HR 92 | Wt 146.0 lb

## 2015-06-29 DIAGNOSIS — K219 Gastro-esophageal reflux disease without esophagitis: Secondary | ICD-10-CM

## 2015-06-29 DIAGNOSIS — Z3403 Encounter for supervision of normal first pregnancy, third trimester: Secondary | ICD-10-CM

## 2015-06-29 DIAGNOSIS — O2643 Herpes gestationis, third trimester: Secondary | ICD-10-CM

## 2015-06-29 LAB — POCT URINALYSIS DIPSTICK
Bilirubin, UA: NEGATIVE
Glucose, UA: NEGATIVE
Ketones, UA: NEGATIVE
LEUKOCYTES UA: NEGATIVE
NITRITE UA: NEGATIVE
Protein, UA: NEGATIVE
RBC UA: NEGATIVE
Spec Grav, UA: 1.01
UROBILINOGEN UA: NEGATIVE
pH, UA: 7

## 2015-06-29 MED ORDER — RANITIDINE HCL 150 MG PO TABS: 150.0000 mg | ORAL_TABLET | Freq: Two times a day (BID) | ORAL | Status: DC

## 2015-06-29 MED ORDER — VALACYCLOVIR HCL 500 MG PO TABS: 500.0000 mg | ORAL_TABLET | Freq: Every day | ORAL | Status: DC

## 2015-06-30 ENCOUNTER — Encounter: Payer: Self-pay | Admitting: Obstetrics

## 2015-06-30 NOTE — Progress Notes (Signed)
Subjective:    Meagan Anderson is a 24 y.o. female being seen today for her obstetrical visit. She is at [redacted]w[redacted]d gestation. Patient reports no complaints. Fetal movement: normal.  Problem List Items Addressed This Visit    None    Visit Diagnoses    Encounter for supervision of normal first pregnancy in third trimester    -  Primary    Relevant Orders    POCT urinalysis dipstick (Completed)    Strep B DNA probe    Herpes gestationis in third trimester        Relevant Medications    valACYclovir (VALTREX) 500 MG tablet    GERD without esophagitis        Relevant Medications    ranitidine (ZANTAC) 150 MG tablet      Patient Active Problem List   Diagnosis Date Noted  . Encounter for routine screening for malformation using ultrasonics   . Rh negative status during pregnancy   . 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 [redacted] weeks gestation of pregnancy   . Hx of herpes simplex type 2 infection 02/01/2015    Objective:    BP 134/90 mmHg  Pulse 92  Wt 146 lb (66.225 kg)  LMP 10/14/2014 FHT: 150 BPM  Uterine Size: size equals dates  Presentations: unsure    Assessment:    Pregnancy @ [redacted]w[redacted]d weeks   Plan:   Plans for delivery: Vaginal anticipated; labs reviewed; problem list updated Counseling: Consent signed. Infant feeding: plans to breastfeed. Cigarette smoking: former smoker. L&D discussion: symptoms of labor, discussed when to call, discussed what number to call, anesthetic/analgesic options reviewed and delivering clinician:  plans no preference. Postpartum supports and preparation: circumcision discussed and contraception plans discussed.  Follow up in 1 Week.

## 2015-07-01 LAB — STREP B DNA PROBE: GBSP: NOT DETECTED

## 2015-07-04 ENCOUNTER — Telehealth: Payer: Self-pay

## 2015-07-04 NOTE — Telephone Encounter (Signed)
fmla papers were faxed to [employer], patient paid the 15.00 - called patient to let her know - she can pick up her copies

## 2015-07-06 ENCOUNTER — Encounter: Payer: Medicaid Other | Admitting: Certified Nurse Midwife

## 2015-07-06 ENCOUNTER — Inpatient Hospital Stay (HOSPITAL_COMMUNITY)
Admission: AD | Admit: 2015-07-06 | Discharge: 2015-07-08 | DRG: 775 | Disposition: A | Discharge status: Home / Self Care | Payer: Managed Care, Other (non HMO) | Source: Ambulatory Visit | Attending: Obstetrics | Admitting: Obstetrics

## 2015-07-06 ENCOUNTER — Encounter (HOSPITAL_COMMUNITY): Payer: Self-pay | Admitting: *Deleted

## 2015-07-06 DIAGNOSIS — Z3A37 37 weeks gestation of pregnancy: Secondary | ICD-10-CM | POA: Diagnosis present

## 2015-07-06 DIAGNOSIS — Z87891 Personal history of nicotine dependence: Secondary | ICD-10-CM | POA: Diagnosis not present

## 2015-07-06 DIAGNOSIS — IMO0001 Reserved for inherently not codable concepts without codable children: Secondary | ICD-10-CM

## 2015-07-06 LAB — ABO/RH: ABO/RH(D): A NEG

## 2015-07-06 LAB — TYPE AND SCREEN
ABO/RH(D): A NEG
Antibody Screen: NEGATIVE

## 2015-07-06 LAB — CBC
HCT: 40.4 % (ref 36.0–46.0)
Hemoglobin: 13.9 g/dL (ref 12.0–15.0)
MCH: 31 pg (ref 26.0–34.0)
MCHC: 34.4 g/dL (ref 30.0–36.0)
MCV: 90.2 fL (ref 78.0–100.0)
PLATELETS: 146 10*3/uL — AB (ref 150–400)
RBC: 4.48 MIL/uL (ref 3.87–5.11)
RDW: 13 % (ref 11.5–15.5)
WBC: 17.8 10*3/uL — ABNORMAL HIGH (ref 4.0–10.5)

## 2015-07-06 MED ORDER — DIPHENHYDRAMINE HCL 50 MG/ML IJ SOLN: 12.5000 mg | INTRAMUSCULAR | Status: DC | PRN

## 2015-07-06 MED ORDER — DIPHENHYDRAMINE HCL 25 MG PO CAPS: 25.0000 mg | ORAL_CAPSULE | Freq: Four times a day (QID) | ORAL | Status: DC | PRN

## 2015-07-06 MED ORDER — ONDANSETRON HCL 4 MG/2ML IJ SOLN: 4.0000 mg | INTRAMUSCULAR | Status: DC | PRN

## 2015-07-06 MED ORDER — FLEET ENEMA 7-19 GM/118ML RE ENEM: 1.0000 | ENEMA | RECTAL | Status: DC | PRN

## 2015-07-06 MED ORDER — LANOLIN HYDROUS EX OINT: TOPICAL_OINTMENT | CUTANEOUS | Status: DC | PRN

## 2015-07-06 MED ORDER — ZOLPIDEM TARTRATE 5 MG PO TABS: 5.0000 mg | ORAL_TABLET | Freq: Every evening | ORAL | Status: DC | PRN

## 2015-07-06 MED ORDER — LACTATED RINGERS IV SOLN: 500.0000 mL | INTRAVENOUS | Status: DC | PRN

## 2015-07-06 MED ORDER — BENZOCAINE-MENTHOL 20-0.5 % EX AERO
1.0000 "application " | INHALATION_SPRAY | CUTANEOUS | Status: DC | PRN
  Administered 2015-07-06: 1 via TOPICAL
  Filled 2015-07-06: qty 56

## 2015-07-06 MED ORDER — EPHEDRINE 5 MG/ML INJ
10.0000 mg | INTRAVENOUS | Status: DC | PRN
  Filled 2015-07-06: qty 2

## 2015-07-06 MED ORDER — FENTANYL CITRATE (PF) 100 MCG/2ML IJ SOLN
100.0000 ug | INTRAMUSCULAR | Status: DC | PRN
Start: 2015-07-06 — End: 2015-07-06

## 2015-07-06 MED ORDER — OXYCODONE-ACETAMINOPHEN 5-325 MG PO TABS: 2.0000 | ORAL_TABLET | ORAL | Status: DC | PRN

## 2015-07-06 MED ORDER — SENNOSIDES-DOCUSATE SODIUM 8.6-50 MG PO TABS
2.0000 | ORAL_TABLET | ORAL | Status: DC
  Filled 2015-07-06 (×2): qty 2

## 2015-07-06 MED ORDER — OXYCODONE-ACETAMINOPHEN 5-325 MG PO TABS
1.0000 | ORAL_TABLET | ORAL | Status: DC | PRN
  Administered 2015-07-06: 1 via ORAL
  Filled 2015-07-06: qty 1

## 2015-07-06 MED ORDER — PHENYLEPHRINE 40 MCG/ML (10ML) SYRINGE FOR IV PUSH (FOR BLOOD PRESSURE SUPPORT)
80.0000 ug | PREFILLED_SYRINGE | INTRAVENOUS | Status: DC | PRN
  Filled 2015-07-06: qty 2

## 2015-07-06 MED ORDER — ACETAMINOPHEN 325 MG PO TABS: 650.0000 mg | ORAL_TABLET | ORAL | Status: DC | PRN

## 2015-07-06 MED ORDER — WITCH HAZEL-GLYCERIN EX PADS: 1.0000 "application " | MEDICATED_PAD | CUTANEOUS | Status: DC | PRN

## 2015-07-06 MED ORDER — METHYLERGONOVINE MALEATE 0.2 MG/ML IJ SOLN
0.2000 mg | INTRAMUSCULAR | Status: DC | PRN
Start: 2015-07-06 — End: 2015-07-06

## 2015-07-06 MED ORDER — FENTANYL 2.5 MCG/ML BUPIVACAINE 1/10 % EPIDURAL INFUSION (WH - ANES): 14.0000 mL/h | INTRAMUSCULAR | Status: DC | PRN

## 2015-07-06 MED ORDER — FAMOTIDINE 20 MG PO TABS
20.0000 mg | ORAL_TABLET | Freq: Two times a day (BID) | ORAL | Status: DC
  Administered 2015-07-08: 20 mg via ORAL
  Filled 2015-07-06 (×2): qty 1

## 2015-07-06 MED ORDER — TETANUS-DIPHTH-ACELL PERTUSSIS 5-2.5-18.5 LF-MCG/0.5 IM SUSP: 0.5000 mL | Freq: Once | INTRAMUSCULAR | Status: DC

## 2015-07-06 MED ORDER — LACTATED RINGERS IV SOLN
INTRAVENOUS | Status: DC
  Administered 2015-07-06: 13:00:00 via INTRAVENOUS

## 2015-07-06 MED ORDER — OB COMPLETE GOLD 27.5-1-200 MG PO CAPS: 1.0000 | ORAL_CAPSULE | Freq: Every day | ORAL | Status: DC

## 2015-07-06 MED ORDER — IBUPROFEN 800 MG PO TABS
800.0000 mg | ORAL_TABLET | Freq: Three times a day (TID) | ORAL | Status: DC
  Administered 2015-07-06: 800 mg via ORAL
  Filled 2015-07-06: qty 1

## 2015-07-06 MED ORDER — MISOPROSTOL 200 MCG PO TABS
ORAL_TABLET | ORAL | Status: AC
  Filled 2015-07-06: qty 5

## 2015-07-06 MED ORDER — PRENATAL MULTIVITAMIN CH
1.0000 | ORAL_TABLET | Freq: Every day | ORAL | Status: DC
  Administered 2015-07-07 – 2015-07-08 (×2): 1 via ORAL
  Filled 2015-07-06 (×2): qty 1

## 2015-07-06 MED ORDER — OXYTOCIN 40 UNITS IN LACTATED RINGERS INFUSION - SIMPLE MED: 62.5000 mL/h | INTRAVENOUS | Status: DC | PRN

## 2015-07-06 MED ORDER — MISOPROSTOL 200 MCG PO TABS
1000.0000 ug | ORAL_TABLET | Freq: Once | ORAL | Status: AC
  Administered 2015-07-06: 1000 ug via RECTAL

## 2015-07-06 MED ORDER — ONDANSETRON HCL 4 MG PO TABS: 4.0000 mg | ORAL_TABLET | ORAL | Status: DC | PRN

## 2015-07-06 MED ORDER — CITRIC ACID-SODIUM CITRATE 334-500 MG/5ML PO SOLN: 30.0000 mL | ORAL | Status: DC | PRN

## 2015-07-06 MED ORDER — OXYTOCIN 40 UNITS IN LACTATED RINGERS INFUSION - SIMPLE MED
62.5000 mL/h | INTRAVENOUS | Status: DC
  Administered 2015-07-06: 62.5 mL/h via INTRAVENOUS
  Filled 2015-07-06: qty 1000

## 2015-07-06 MED ORDER — SIMETHICONE 80 MG PO CHEW: 80.0000 mg | CHEWABLE_TABLET | ORAL | Status: DC | PRN

## 2015-07-06 MED ORDER — ONDANSETRON HCL 4 MG/2ML IJ SOLN: 4.0000 mg | Freq: Four times a day (QID) | INTRAMUSCULAR | Status: DC | PRN

## 2015-07-06 MED ORDER — LIDOCAINE HCL (PF) 1 % IJ SOLN
30.0000 mL | INTRAMUSCULAR | Status: DC | PRN
  Filled 2015-07-06: qty 30

## 2015-07-06 MED ORDER — METHYLERGONOVINE MALEATE 0.2 MG PO TABS
0.2000 mg | ORAL_TABLET | ORAL | Status: DC | PRN
  Filled 2015-07-06: qty 1

## 2015-07-06 MED ORDER — OXYCODONE-ACETAMINOPHEN 5-325 MG PO TABS: 1.0000 | ORAL_TABLET | ORAL | Status: DC | PRN

## 2015-07-06 MED ORDER — DIBUCAINE 1 % RE OINT: 1.0000 "application " | TOPICAL_OINTMENT | RECTAL | Status: DC | PRN

## 2015-07-06 MED ORDER — OXYTOCIN BOLUS FROM INFUSION
500.0000 mL | INTRAVENOUS | Status: DC
  Administered 2015-07-06: 500 mL via INTRAVENOUS

## 2015-07-06 MED ORDER — METHYLERGONOVINE MALEATE 0.2 MG/ML IJ SOLN
INTRAMUSCULAR | Status: AC
  Administered 2015-07-06: 0.2 mg
  Filled 2015-07-06: qty 1

## 2015-07-06 MED ORDER — IBUPROFEN 600 MG PO TABS
600.0000 mg | ORAL_TABLET | Freq: Four times a day (QID) | ORAL | Status: DC
  Administered 2015-07-06 – 2015-07-08 (×7): 600 mg via ORAL
  Filled 2015-07-06 (×7): qty 1

## 2015-07-06 NOTE — MAU Note (Signed)
Pt states her water broke prior to RN walking in room about 1155.  Pt had copious yellowish fluid on floor and running out the door.  Pt states she wasn't able to urinate when in restroom right before this happened.

## 2015-07-06 NOTE — H&P (Signed)
Meagan Anderson is a 24 y.o. female presenting for spontaneous labor. History OB History    Gravida Para Term Preterm AB TAB SAB Ectopic Multiple Living   1         0     Past Medical History  Diagnosis Date  . Herpes   . Seasonal allergies    Past Surgical History  Procedure Laterality Date  . Orif ankle fracture Right 2008    plate and 11 screws  . Mouth surgery     Family History: family history includes Heart disease in her maternal grandmother; Hyperlipidemia in her maternal grandmother. Social History:  reports that she has quit smoking. She does not have any smokeless tobacco history on file. She reports that she does not drink alcohol or use illicit drugs.   Prenatal Transfer Tool  Maternal Diabetes: No Genetic Screening: Normal Maternal Ultrasounds/Referrals: Normal Fetal Ultrasounds or other Referrals:  None Maternal Substance Abuse:  No Significant Maternal Medications:  None Significant Maternal Lab Results:  None Other Comments:  None  ROS  Dilation: 10 Effacement (%): 100 Station: -3 Exam by:: Ivonne Andrew, CNM Blood pressure 138/80, pulse 86, temperature 98.5 F (36.9 C), temperature source Oral, resp. rate 18, last menstrual period 10/14/2014. Exam Physical Exam  Prenatal labs: ABO, Rh: --/--/A NEG (09/28 1222) Antibody: NEG (09/28 1222) Rubella: 1.06 (04/26 1547) RPR: NON REAC (07/22 1409)  HBsAg: NEGATIVE (04/26 1547)  HIV: NONREACTIVE (07/22 1409)  GBS: NOT DETECTED (09/21 1543)   Assessment/Plan: Admit for active labor at term   Meagan Anderson 07/06/2015, 1:28 PM

## 2015-07-06 NOTE — MAU Note (Signed)
Been feeling menstrual like cramps during the night.  Woke at 0530 nauseated, vomiting and diarrhea

## 2015-07-07 LAB — CBC
HEMATOCRIT: 38.3 % (ref 36.0–46.0)
Hemoglobin: 12.8 g/dL (ref 12.0–15.0)
MCH: 30.3 pg (ref 26.0–34.0)
MCHC: 33.4 g/dL (ref 30.0–36.0)
MCV: 90.8 fL (ref 78.0–100.0)
PLATELETS: 125 10*3/uL — AB (ref 150–400)
RBC: 4.22 MIL/uL (ref 3.87–5.11)
RDW: 13.3 % (ref 11.5–15.5)
WBC: 16.6 10*3/uL — ABNORMAL HIGH (ref 4.0–10.5)

## 2015-07-07 LAB — RPR: RPR Ser Ql: NONREACTIVE

## 2015-07-07 MED ORDER — RHO D IMMUNE GLOBULIN 1500 UNIT/2ML IJ SOSY
300.0000 ug | PREFILLED_SYRINGE | Freq: Once | INTRAMUSCULAR | Status: AC
  Administered 2015-07-07: 300 ug via INTRAVENOUS
  Filled 2015-07-07: qty 2

## 2015-07-07 NOTE — Progress Notes (Signed)
Post Partum Day #1 Subjective: no complaints, up ad lib, voiding and tolerating PO. Breastfeeding.   Objective: Blood pressure 132/73, pulse 69, temperature 98.2 F (36.8 C), temperature source Oral, resp. rate 18, last menstrual period 10/14/2014, SpO2 99 %, unknown if currently breastfeeding.  Physical Exam:  General: alert, cooperative and no distress Lochia: appropriate Uterine Fundus: firm Incision: none DVT Evaluation: No evidence of DVT seen on physical exam. No cords or calf tenderness. No significant calf/ankle edema.   Recent Labs  07/06/15 1223 07/07/15 0525  HGB 13.9 12.8  HCT 40.4 38.3    Assessment/Plan: Plan for discharge tomorrow, Breastfeeding, Lactation consult and Social Work consult   LOS: 1 day   [name] 07/07/2015, 9:20 AM

## 2015-07-07 NOTE — Lactation Note (Addendum)
This note was copied from the chart of Meagan Tamila Gaulin. Lactation Consultation Note  Patient Name: Meagan Anderson [id number] Date: 07/07/2015 Reason for consult: Initial assessment;Infant < 6lbs;Other (Comment) (today , 2nd day )  Baby is 61 hours old, 37 6/7 breast feeding 10-20 mins x 7 and attempts. 3 voids , 5 stool.  Last stool yellow. Latch score - 8-9 's . Serum Bili - 5  @ consult baby already latched in cradle position with depth , consistent swallowing pattern, increased with  Breast compressions. Baby fed 15 mins , released. PKU done by [facility] RN , baby fussy , gasey , LC changed  A yellow stool diaper. LC assisted mom to latch on the right breast in football position , nipple semi inverted , areola  Compressible, and baby latched with wide open mouth and depth. Baby still actively feeding with swallows.  LC reviewed basics with mom and grandmother. Grandmother was asking about pumping - and at this time due to baby being consistent , even at his weight,  LC felt pumping could be reassessed tomorrow. Both mom and grandmother receptive to teaching. LC also mentioned with a smaller baby , LC recommended F/U for LC O/P apt. To be made day of [facility]. Grand mother thought maybe mom would be able to obtain a DEBP .      Maternal Data Has patient been taught Hand Expression?: Yes  Feeding Feeding Type: Breast Fed Length of feed:  (multiply swallows and still feeding )  LATCH Score/Interventions Latch: Grasps breast easily, tongue down, lips flanged, rhythmical sucking. (Football ,)  Audible Swallowing: Spontaneous and intermittent  Type of Nipple: Everted at rest and after stimulation (semi inverted , areola compressible )  Comfort (Breast/Nipple): Soft / non-tender     Hold (Positioning): Assistance needed to correctly position infant at breast and maintain latch.  LATCH Score: 9  Lactation Tools Discussed/Used WIC Program: No   Consult Status Consult Status:  Follow-up Date: 07/08/15 Follow-up type: In-patient    Kathrin Greathouse 07/07/2015, 3:39 PM

## 2015-07-07 NOTE — Progress Notes (Signed)
UR chart review completed.  
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
## 2015-07-08 ENCOUNTER — Ambulatory Visit: Payer: Self-pay

## 2015-07-08 LAB — RH IG WORKUP (INCLUDES ABO/RH)
ABO/RH(D): A NEG
FETAL SCREEN: NEGATIVE
Gestational Age(Wks): 37
UNIT DIVISION: 0

## 2015-07-08 MED ORDER — IBUPROFEN 600 MG PO TABS: 600.0000 mg | ORAL_TABLET | Freq: Four times a day (QID) | ORAL | Status: DC | PRN

## 2015-07-08 MED ORDER — OXYCODONE-ACETAMINOPHEN 5-325 MG PO TABS: 1.0000 | ORAL_TABLET | ORAL | Status: DC | PRN

## 2015-07-08 NOTE — Progress Notes (Signed)
Pt d/c but will room in  Process  Explained to pt and family
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
## 2015-07-08 NOTE — Lactation Note (Signed)
This note was copied from the chart of Meagan Jisell Majer. Lactation Consultation Note; Mom has pumped about 3 cc's of transitional milk. Baby very fussy, Grandmother fed EBM to baby- tried with spoon but she did not like that. Used curved tip syringe and baby took it well. Still fussy. Mom eating lunch. Attempted to latch baby in football hold to right breast. He was so fussy would not latch. Latched well to left breast and still nursing when I left room. Encouraged mom continue trying to latch baby to right breast. Has insurance- will call them about a pump for home. No questions at present. To call prn  Patient Name: Meagan Anderson [id number] Date: 07/08/2015 Reason for consult: Follow-up assessment   Maternal Data Formula Feeding for Exclusion: No Has patient been taught Hand Expression?: Yes Does the patient have breastfeeding experience prior to this delivery?: No  Feeding Feeding Type: Breast Fed Length of feed: 30 min  LATCH Score/Interventions Latch: Grasps breast easily, tongue down, lips flanged, rhythmical sucking.  Audible Swallowing: A few with stimulation  Type of Nipple: Everted at rest and after stimulation  Comfort (Breast/Nipple): Soft / non-tender     Hold (Positioning): No assistance needed to correctly position infant at breast.  LATCH Score: 9  Lactation Tools Discussed/Used WIC Program: No Initiated by:: RN Date initiated:: 07/08/15   Consult Status Consult Status: PRN    Pamelia Hoit 07/08/2015, 3:57 PM

## 2015-07-08 NOTE — Discharge Summary (Signed)
Obstetric Discharge Summary Reason for Admission: onset of labor Prenatal Procedures: ultrasound Intrapartum Procedures: spontaneous vaginal delivery Postpartum Procedures: none Complications-Operative and Postpartum: none HEMOGLOBIN  Date Value Ref Range Status  07/07/2015 12.8 12.0 - 15.0 g/dL Final   HCT  Date Value Ref Range Status  07/07/2015 38.3 36.0 - 46.0 % Final    Physical Exam:  General: alert and no distress Lochia: appropriate Uterine Fundus: firm Incision: none DVT Evaluation: No evidence of DVT seen on physical exam.  Discharge Diagnoses: Term Pregnancy-delivered  Discharge Information: Date: 07/08/2015 Activity: pelvic rest Diet: routine Medications: PNV, Ibuprofen, Colace and Percocet Condition: stable Instructions: refer to practice specific booklet Discharge to: home Follow-up Information    Follow up with Roe Coombs, CNM. Schedule an appointment as soon as possible for a visit in 2 weeks.   Specialty:  Certified Nurse Midwife   Contact information:   [address] [place] [address] [phone]       Newborn Data: Live born female  Birth Weight: 6 lb 1 oz (2750 g) APGAR: 8, 9  Home with mother.  HARPER,CHARLES A 07/08/2015, 9:23 AM

## 2015-07-08 NOTE — Progress Notes (Signed)
Post Partum Day 2 Subjective: no complaints  Objective: Blood pressure 130/73, pulse 80, temperature 98.5 F (36.9 C), temperature source Oral, resp. rate 20, height  (1.702 m), weight 164 lb (74.39 kg), last menstrual period 10/14/2014, SpO2 99 %, unknown if currently breastfeeding.  Physical Exam:  General: alert and no distress Lochia: appropriate Uterine Fundus: firm Incision: none DVT Evaluation: No evidence of DVT seen on physical exam.   Recent Labs  07/06/15 1223 07/07/15 0525  HGB 13.9 12.8  HCT 40.4 38.3    Assessment/Plan: Discharge home   LOS: 2 days   HARPER,CHARLES A 07/08/2015, 9:19 AM
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
## 2015-07-09 ENCOUNTER — Ambulatory Visit: Payer: Self-pay

## 2015-07-09 NOTE — Lactation Note (Signed)
This note was copied from the chart of Meagan Anderson. Lactation Consultation Note: follow up for feeding consult. Infant is at 4% weight loss and is 5lbs 9 ounces . Infant is 37.6 weeks . He has had good output. Mother has been breastfeeding every 2-3 hours.  Mother dozing with  infant cradled in her arms. Advised mother to pump breast and call for the next feeding to be observed. Mother states that she is active with WIC. She doesn't have a scheduled appt at this time. Advised mother to phone [facility] on Monday. Mother has a hand pump. She states she has not used. She used the DEBP yesterday. Mother has not supplemented infant with any amt of EBM.   Patient Name: Meagan Phyllistine Domingos [id number] Date: 07/09/2015 Reason for consult: Follow-up assessment   Maternal Data    Feeding Feeding Type: Breast Fed Length of feed: 25 min  LATCH Score/Interventions Latch: Grasps breast easily, tongue down, lips flanged, rhythmical sucking.  Audible Swallowing: Spontaneous and intermittent Intervention(s): Skin to skin;Hand expression  Type of Nipple: Everted at rest and after stimulation  Comfort (Breast/Nipple): Filling, red/small blisters or bruises, mild/mod discomfort  Problem noted: Filling;Mild/Moderate discomfort Interventions (Filling): Firm support;Hand pump Interventions (Mild/moderate discomfort): Pre-pump if needed;Comfort gels  Hold (Positioning): No assistance needed to correctly position infant at breast. Intervention(s): Support Pillows;Position options;Skin to skin  LATCH Score: 9  Lactation Tools Discussed/Used     Consult Status Consult Status: Follow-up Date: 07/11/15 Follow-up type: Other (comment) ([facility] for [facility] assistance)    Stevan Born [facility] 07/09/2015, 1:43 PM

## 2015-07-09 NOTE — Lactation Note (Signed)
This note was copied from the chart of Meagan Anderson. Lactation Consultation Note; Mother paged to have LC observe infant feeding. Mother's breast are very full. Advised mother in pre pumping with hand pump if needed. Mother has infant in cradle position without support. Infant having a difficult time getting latched. Advised mother to use pillow support and hold infant close. Mother resistant to any tips for latching infant. Observed infant with a shallow latch but mother's milk is coming in. Infant is gulping milk from breast. Mother states she is slightly sore. Mother was given conformt gels. Mother was also given LPT parent instruction sheet and advised to offer infant supplement of ebm if unable to keep infant actively suckling. Discussed LPI infant and small babys need for additional calories. Mother has a plan to see LC at [facility] for follow up . Mother advised to follow up with Peds regular weigh checks.   Patient Name: Meagan Anderson [id number] Date: 07/09/2015 Reason for consult: Follow-up assessment   Maternal Data    Feeding Feeding Type: Breast Fed Length of feed: 25 min  LATCH Score/Interventions Latch: Grasps breast easily, tongue down, lips flanged, rhythmical sucking.  Audible Swallowing: Spontaneous and intermittent Intervention(s): Skin to skin;Hand expression  Type of Nipple: Everted at rest and after stimulation  Comfort (Breast/Nipple): Filling, red/small blisters or bruises, mild/mod discomfort  Problem noted: Filling;Mild/Moderate discomfort Interventions (Filling): Firm support;Hand pump Interventions (Mild/moderate discomfort): Pre-pump if needed;Comfort gels  Hold (Positioning): No assistance needed to correctly position infant at breast. Intervention(s): Support Pillows;Position options;Skin to skin  LATCH Score: 9  Lactation Tools Discussed/Used     Consult Status Consult Status: Follow-up Date: 07/11/15 Follow-up type: Other  (comment) ([facility] for [facility] assistance)    Stevan Born McCoy 07/09/2015, 1:50 PM

## 2016-10-11 DIAGNOSIS — Y9389 Activity, other specified: Secondary | ICD-10-CM | POA: Insufficient documentation

## 2016-10-11 DIAGNOSIS — S62635A Displaced fracture of distal phalanx of left ring finger, initial encounter for closed fracture: Secondary | ICD-10-CM | POA: Insufficient documentation

## 2016-10-11 DIAGNOSIS — Y929 Unspecified place or not applicable: Secondary | ICD-10-CM | POA: Insufficient documentation

## 2016-10-11 DIAGNOSIS — Z87891 Personal history of nicotine dependence: Secondary | ICD-10-CM | POA: Insufficient documentation

## 2016-10-11 DIAGNOSIS — W010XXA Fall on same level from slipping, tripping and stumbling without subsequent striking against object, initial encounter: Secondary | ICD-10-CM | POA: Insufficient documentation

## 2016-10-11 DIAGNOSIS — Y999 Unspecified external cause status: Secondary | ICD-10-CM | POA: Insufficient documentation

## 2016-10-12 ENCOUNTER — Emergency Department (HOSPITAL_COMMUNITY): Payer: Self-pay

## 2016-10-12 ENCOUNTER — Emergency Department (HOSPITAL_COMMUNITY)
Admission: EM | Admit: 2016-10-12 | Discharge: 2016-10-12 | Disposition: A | Discharge status: Home / Self Care | Payer: Self-pay | Attending: Emergency Medicine | Admitting: Emergency Medicine

## 2016-10-12 ENCOUNTER — Encounter (HOSPITAL_COMMUNITY): Payer: Self-pay | Admitting: Emergency Medicine

## 2016-10-12 DIAGNOSIS — S62635A Displaced fracture of distal phalanx of left ring finger, initial encounter for closed fracture: Secondary | ICD-10-CM

## 2016-10-12 MED ORDER — NAPROXEN 375 MG PO TABS: 375.0000 mg | ORAL_TABLET | Freq: Two times a day (BID) | ORAL | 0 refills | Status: DC

## 2016-10-12 MED ORDER — HYDROCODONE-ACETAMINOPHEN 5-325 MG PO TABS: 1.0000 | ORAL_TABLET | Freq: Four times a day (QID) | ORAL | 0 refills | Status: DC | PRN

## 2016-10-12 MED ORDER — IBUPROFEN 400 MG PO TABS
600.0000 mg | ORAL_TABLET | Freq: Once | ORAL | Status: AC
  Administered 2016-10-12: 600 mg via ORAL
  Filled 2016-10-12: qty 1

## 2016-10-12 MED ORDER — OXYCODONE-ACETAMINOPHEN 5-325 MG PO TABS
1.0000 | ORAL_TABLET | Freq: Once | ORAL | Status: AC
  Administered 2016-10-12: 1 via ORAL
  Filled 2016-10-12: qty 1

## 2016-10-12 NOTE — Progress Notes (Signed)
Orthopedic Tech Progress Note Patient Details:  Meagan Anderson 06/16/1991 [id number]  Ortho Devices Type of Ortho Device: Finger splint Ortho Device/Splint Location: l ring finger Ortho Device/Splint Interventions: Ordered, Application   [name], [name] 10/12/2016, 2:25 AM

## 2016-10-12 NOTE — ED Triage Notes (Signed)
Patient tripped and fell injured her left 4th finger with mild swelling this evening .

## 2016-10-12 NOTE — ED Provider Notes (Signed)
[facility]-EMERGENCY DEPT Provider Note   CSN: [id number] Arrival date & time: 10/11/16  2354     History   Chief Complaint Chief Complaint  Patient presents with  . Finger Injury    HPI Meagan Anderson is a 26 y.o. female who presents to the ED with pain and swelling of the left ring finger s/p fall earlier in the evening. Patient states that she tripped and fell with her hand out causing the injury to her finger. She complains of pain and swelling to the finger. She denies any other injuries.   HPI  Past Medical History:  Diagnosis Date  . Herpes   . Seasonal allergies     Patient Active Problem List   Diagnosis Date Noted  . Active labor 07/06/2015  . NSVD (normal spontaneous vaginal delivery) 07/06/2015  . Encounter for routine screening for malformation using ultrasonics   . Rh negative status during pregnancy   . 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 [redacted] weeks gestation of pregnancy   . 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 MOUTH SURGERY    . ORIF ANKLE FRACTURE Right 2008   plate and 11 screws    OB History    Gravida Para Term Preterm AB Living   1 1 1     1    SAB TAB Ectopic Multiple Live Births         0 1       Home Medications    Prior to Admission medications   Medication Sig Start Date End Date Taking? Authorizing Provider  HYDROcodone-acetaminophen (NORCO) 5-325 MG tablet Take 1 tablet by mouth every 6 (six) hours as needed. 10/12/16   Hope Orlene OchM Neese, NP  ibuprofen (ADVIL,MOTRIN) 600 MG tablet Take 1 tablet (600 mg total) by mouth every 6 (six) hours as needed for mild pain. 07/08/15   Brock Badharles A Harper, MD  naproxen (NAPROSYN) 375 MG tablet Take 1 tablet (375 mg total) by mouth 2 (two) times daily. 10/12/16   Hope Orlene OchM Neese, NP  oxyCODONE-acetaminophen (PERCOCET/ROXICET) 5-325 MG tablet Take 1-2 tablets by mouth every 4 (four) hours as needed for moderate pain or severe pain (for pain  scale greater than 7). 07/08/15   Brock Badharles A Harper, MD    Family History Family History  Problem Relation Age of Onset  . Hyperlipidemia Maternal Grandmother   . Heart disease Maternal Grandmother     Social History Social History  Substance Use Topics  . Smoking status: Former Games developermoker  . Smokeless tobacco: Never Used  . Alcohol use No     Allergies   Patient has no known allergies.   Review of Systems Review of Systems Negative except as stated in HPI  Physical Exam Updated Vital Signs BP 142/89 (BP Location: Right Arm)   Pulse 85   Temp 98 F (36.7 C) (Oral)   Resp 18   Ht 5\' 6"  (1.676 m)   Wt 64 kg   LMP 09/29/2016 (Approximate)   SpO2 100%   BMI 22.79 kg/m   Physical Exam  Constitutional: She is oriented to person, place, and time. She appears well-developed and well-nourished.  HENT:  Head: Normocephalic and atraumatic.  Eyes: EOM are normal.  Neck: Neck supple.  Cardiovascular: Normal rate.   Pulmonary/Chest: Effort normal.  Musculoskeletal:       Left hand: She exhibits swelling. She exhibits normal capillary refill. Decreased range of motion: due  to swelling and pain. Normal sensation noted. Normal strength noted.  Swelling, tenderness and ecchymosis to the the left ring finger at the PIP. Limited range of motion due to swelling and pain.   Neurological: She is alert and oriented to person, place, and time. No cranial nerve deficit.  Skin: Skin is warm and dry.  Psychiatric: She has a normal mood and affect. Her behavior is normal.  Nursing note and vitals reviewed.    ED Treatments / Results  Labs (all labs ordered are listed, but only abnormal results are displayed) Labs Reviewed - No data to display  Radiology Dg Finger Ring Left  Result Date: 10/12/2016 CLINICAL DATA:  Larey Seat onto outstretched hand today EXAM: LEFT RING FINGER 2+V COMPARISON:  None. FINDINGS: There is a spiral fracture of the fourth proximal phalanx extending from proximal to  distal diaphysis with 1 cortex width radial displacement distally and with mild foreshortening. Articular surface is spared. No dislocation. No radiopaque foreign body. IMPRESSION: Fourth proximal phalangeal fracture.  No dislocation. Electronically Signed   By: Ellery Plunk M.D.   On: 10/12/2016 00:47    Procedures Procedures (including critical care time)  Medications Ordered in ED Medications  ibuprofen (ADVIL,MOTRIN) tablet 600 mg (600 mg Oral Given 10/12/16 0031)  oxyCODONE-acetaminophen (PERCOCET/ROXICET) 5-325 MG per tablet 1 tablet (1 tablet Oral Given 10/12/16 0132)     Initial Impression / Assessment and Plan / ED Course  I have reviewed the triage vital signs and the nursing notes.  Pertinent imaging results that were available during my care of the patient were reviewed by me and considered in my medical decision making (see chart for details).  Clinical Course   consult with Dr. Merlyn Lot and we will splint the finger and have patient f/u in the office to discuss surgery. Patient stable for d/c without focal neuro deficits. Return precautions given, splint applied, pain management.   Final Clinical Impressions(s) / ED Diagnoses   Final diagnoses:  Closed displaced fracture of distal phalanx of left ring finger, initial encounter    New Prescriptions Discharge Medication List as of 10/12/2016  1:26 AM    START taking these medications   Details  HYDROcodone-acetaminophen (NORCO) 5-325 MG tablet Take 1 tablet by mouth every 6 (six) hours as needed., Starting Fri 10/12/2016, Print    naproxen (NAPROSYN) 375 MG tablet Take 1 tablet (375 mg total) by mouth 2 (two) times daily., Starting Fri 10/12/2016, [address] [place], NP 10/12/16 1610    Rolland Porter, MD 10/26/16 1540

## 2016-10-12 NOTE — Discharge Instructions (Signed)
Call Dr. Heinz KnucklesKuzman's office in the morning to schedule follow up.  Do not drive while taking the narcotic as it will make you sleepy.

## 2016-10-16 ENCOUNTER — Other Ambulatory Visit: Payer: Self-pay | Admitting: Orthopedic Surgery

## 2016-10-17 ENCOUNTER — Encounter (HOSPITAL_BASED_OUTPATIENT_CLINIC_OR_DEPARTMENT_OTHER): Payer: Self-pay | Admitting: *Deleted

## 2016-10-22 ENCOUNTER — Ambulatory Visit (HOSPITAL_BASED_OUTPATIENT_CLINIC_OR_DEPARTMENT_OTHER)
Admission: RE | Admit: 2016-10-22 | Discharge: 2016-10-22 | Disposition: A | Discharge status: Home / Self Care | Payer: BLUE CROSS/BLUE SHIELD | Source: Ambulatory Visit | Attending: Orthopedic Surgery | Admitting: Orthopedic Surgery

## 2016-10-22 ENCOUNTER — Ambulatory Visit (HOSPITAL_BASED_OUTPATIENT_CLINIC_OR_DEPARTMENT_OTHER): Payer: BLUE CROSS/BLUE SHIELD | Admitting: Anesthesiology

## 2016-10-22 ENCOUNTER — Encounter (HOSPITAL_BASED_OUTPATIENT_CLINIC_OR_DEPARTMENT_OTHER)
Admission: RE | Disposition: A | Discharge status: Home / Self Care | Payer: Self-pay | Source: Ambulatory Visit | Attending: Orthopedic Surgery

## 2016-10-22 ENCOUNTER — Encounter (HOSPITAL_BASED_OUTPATIENT_CLINIC_OR_DEPARTMENT_OTHER): Payer: Self-pay

## 2016-10-22 DIAGNOSIS — S62615A Displaced fracture of proximal phalanx of left ring finger, initial encounter for closed fracture: Secondary | ICD-10-CM | POA: Diagnosis present

## 2016-10-22 DIAGNOSIS — Z791 Long term (current) use of non-steroidal anti-inflammatories (NSAID): Secondary | ICD-10-CM | POA: Diagnosis not present

## 2016-10-22 DIAGNOSIS — X58XXXA Exposure to other specified factors, initial encounter: Secondary | ICD-10-CM | POA: Diagnosis not present

## 2016-10-22 HISTORY — PX: OPEN REDUCTION INTERNAL FIXATION (ORIF) PROXIMAL PHALANX: SHX6235

## 2016-10-22 HISTORY — DX: Other fracture of unspecified lower leg, initial encounter for closed fracture: S82.899A

## 2016-10-22 SURGERY — OPEN REDUCTION INTERNAL FIXATION (ORIF) PROXIMAL PHALANX
Anesthesia: General | Site: Finger | Laterality: Left

## 2016-10-22 MED ORDER — MIDAZOLAM HCL 2 MG/2ML IJ SOLN
1.0000 mg | INTRAMUSCULAR | Status: DC | PRN
  Administered 2016-10-22: 2 mg via INTRAVENOUS

## 2016-10-22 MED ORDER — ONDANSETRON HCL 4 MG/2ML IJ SOLN
INTRAMUSCULAR | Status: DC | PRN
  Administered 2016-10-22: 4 mg via INTRAVENOUS

## 2016-10-22 MED ORDER — DEXAMETHASONE SODIUM PHOSPHATE 4 MG/ML IJ SOLN
INTRAMUSCULAR | Status: DC | PRN
  Administered 2016-10-22: 10 mg via INTRAVENOUS

## 2016-10-22 MED ORDER — ONDANSETRON HCL 4 MG/2ML IJ SOLN
INTRAMUSCULAR | Status: AC
Start: 2016-10-22 — End: 2016-10-22
  Filled 2016-10-22: qty 2

## 2016-10-22 MED ORDER — LIDOCAINE 2% (20 MG/ML) 5 ML SYRINGE
INTRAMUSCULAR | Status: AC
  Filled 2016-10-22: qty 5

## 2016-10-22 MED ORDER — FENTANYL CITRATE (PF) 100 MCG/2ML IJ SOLN
INTRAMUSCULAR | Status: AC
  Filled 2016-10-22: qty 2

## 2016-10-22 MED ORDER — CHLORHEXIDINE GLUCONATE 4 % EX LIQD: 60.0000 mL | Freq: Once | CUTANEOUS | Status: DC

## 2016-10-22 MED ORDER — SCOPOLAMINE 1 MG/3DAYS TD PT72: 1.0000 | MEDICATED_PATCH | Freq: Once | TRANSDERMAL | Status: DC | PRN

## 2016-10-22 MED ORDER — FENTANYL CITRATE (PF) 100 MCG/2ML IJ SOLN
25.0000 ug | INTRAMUSCULAR | Status: DC | PRN
  Administered 2016-10-22: 50 ug via INTRAVENOUS

## 2016-10-22 MED ORDER — OXYCODONE HCL 5 MG PO TABS
5.0000 mg | ORAL_TABLET | Freq: Once | ORAL | Status: AC
  Administered 2016-10-22: 5 mg via ORAL

## 2016-10-22 MED ORDER — BUPIVACAINE HCL (PF) 0.25 % IJ SOLN
INTRAMUSCULAR | Status: DC | PRN
  Administered 2016-10-22: 9 mL

## 2016-10-22 MED ORDER — MIDAZOLAM HCL 2 MG/2ML IJ SOLN
INTRAMUSCULAR | Status: AC
  Filled 2016-10-22: qty 2

## 2016-10-22 MED ORDER — DEXAMETHASONE SODIUM PHOSPHATE 10 MG/ML IJ SOLN
INTRAMUSCULAR | Status: AC
  Filled 2016-10-22: qty 1

## 2016-10-22 MED ORDER — KETOROLAC TROMETHAMINE 30 MG/ML IJ SOLN
INTRAMUSCULAR | Status: DC | PRN
  Administered 2016-10-22: 30 mg via INTRAVENOUS

## 2016-10-22 MED ORDER — KETOROLAC TROMETHAMINE 30 MG/ML IJ SOLN
INTRAMUSCULAR | Status: AC
  Filled 2016-10-22: qty 1

## 2016-10-22 MED ORDER — LIDOCAINE 2% (20 MG/ML) 5 ML SYRINGE
INTRAMUSCULAR | Status: DC | PRN
  Administered 2016-10-22: 50 mg via INTRAVENOUS

## 2016-10-22 MED ORDER — ACETAMINOPHEN 500 MG PO TABS
1000.0000 mg | ORAL_TABLET | Freq: Once | ORAL | Status: AC
  Administered 2016-10-22: 1000 mg via ORAL

## 2016-10-22 MED ORDER — FENTANYL CITRATE (PF) 100 MCG/2ML IJ SOLN
50.0000 ug | INTRAMUSCULAR | Status: AC | PRN
  Administered 2016-10-22: 100 ug via INTRAVENOUS
  Administered 2016-10-22: 25 ug via INTRAVENOUS
  Administered 2016-10-22: 50 ug via INTRAVENOUS
  Administered 2016-10-22: 25 ug via INTRAVENOUS

## 2016-10-22 MED ORDER — ACETAMINOPHEN 500 MG PO TABS
ORAL_TABLET | ORAL | Status: AC
  Filled 2016-10-22: qty 2

## 2016-10-22 MED ORDER — HYDROCODONE-ACETAMINOPHEN 5-325 MG PO TABS: ORAL_TABLET | ORAL | 0 refills | Status: DC

## 2016-10-22 MED ORDER — OXYCODONE HCL 5 MG PO TABS
ORAL_TABLET | ORAL | Status: AC
  Filled 2016-10-22: qty 1

## 2016-10-22 MED ORDER — CEFAZOLIN SODIUM-DEXTROSE 2-4 GM/100ML-% IV SOLN
INTRAVENOUS | Status: AC
  Filled 2016-10-22: qty 100

## 2016-10-22 MED ORDER — PROPOFOL 10 MG/ML IV BOLUS
INTRAVENOUS | Status: DC | PRN
  Administered 2016-10-22: 200 mg via INTRAVENOUS

## 2016-10-22 MED ORDER — CEFAZOLIN SODIUM-DEXTROSE 2-4 GM/100ML-% IV SOLN
2.0000 g | INTRAVENOUS | Status: AC
  Administered 2016-10-22: 2 g via INTRAVENOUS

## 2016-10-22 MED ORDER — LACTATED RINGERS IV SOLN
INTRAVENOUS | Status: DC
  Administered 2016-10-22: 10:00:00 via INTRAVENOUS

## 2016-10-22 SURGICAL SUPPLY — 61 items
BANDAGE ACE 3X5.8 VEL STRL LF (GAUZE/BANDAGES/DRESSINGS) ×3 IMPLANT
BIT DRILL 1.0 W/MINI QC (BIT) ×2 IMPLANT
BIT DRILL 1.0MM W/MINI QC (BIT) ×1
BIT DRIVER 1.3 (BIT) ×3 IMPLANT
BLADE MINI RND TIP GREEN BEAV (BLADE) IMPLANT
BLADE SURG 15 STRL LF DISP TIS (BLADE) ×2 IMPLANT
BLADE SURG 15 STRL SS (BLADE) ×4
BNDG ESMARK 4X9 LF (GAUZE/BANDAGES/DRESSINGS) ×3 IMPLANT
BNDG GAUZE ELAST 4 BULKY (GAUZE/BANDAGES/DRESSINGS) ×3 IMPLANT
CHLORAPREP W/TINT 26ML (MISCELLANEOUS) ×3 IMPLANT
CORDS BIPOLAR (ELECTRODE) ×3 IMPLANT
COVER BACK TABLE 60X90IN (DRAPES) ×3 IMPLANT
COVER MAYO STAND STRL (DRAPES) ×3 IMPLANT
CUFF TOURNIQUET SINGLE 18IN (TOURNIQUET CUFF) ×3 IMPLANT
DRAPE EXTREMITY T 121X128X90 (DRAPE) ×3 IMPLANT
DRAPE OEC MINIVIEW 54X84 (DRAPES) ×3 IMPLANT
DRAPE SURG 17X23 STRL (DRAPES) ×3 IMPLANT
GAUZE SPONGE 4X4 12PLY STRL (GAUZE/BANDAGES/DRESSINGS) ×3 IMPLANT
GAUZE XEROFORM 1X8 LF (GAUZE/BANDAGES/DRESSINGS) ×3 IMPLANT
GLOVE BIO SURGEON STRL SZ 6.5 (GLOVE) ×2 IMPLANT
GLOVE BIO SURGEON STRL SZ7.5 (GLOVE) ×3 IMPLANT
GLOVE BIO SURGEONS STRL SZ 6.5 (GLOVE) ×1
GLOVE BIOGEL PI IND STRL 6.5 (GLOVE) ×1 IMPLANT
GLOVE BIOGEL PI IND STRL 7.0 (GLOVE) ×1 IMPLANT
GLOVE BIOGEL PI IND STRL 8 (GLOVE) ×1 IMPLANT
GLOVE BIOGEL PI IND STRL 8.5 (GLOVE) ×1 IMPLANT
GLOVE BIOGEL PI INDICATOR 6.5 (GLOVE) ×2
GLOVE BIOGEL PI INDICATOR 7.0 (GLOVE) ×2
GLOVE BIOGEL PI INDICATOR 8 (GLOVE) ×2
GLOVE BIOGEL PI INDICATOR 8.5 (GLOVE) ×2
GLOVE SURG ORTHO 8.0 STRL STRW (GLOVE) ×3 IMPLANT
GOWN STRL REUS W/ TWL LRG LVL3 (GOWN DISPOSABLE) ×1 IMPLANT
GOWN STRL REUS W/TWL LRG LVL3 (GOWN DISPOSABLE) ×2
GOWN STRL REUS W/TWL XL LVL3 (GOWN DISPOSABLE) ×6 IMPLANT
NEEDLE HYPO 25X1 1.5 SAFETY (NEEDLE) ×3 IMPLANT
NS IRRIG 1000ML POUR BTL (IV SOLUTION) ×3 IMPLANT
PACK BASIN DAY SURGERY FS (CUSTOM PROCEDURE TRAY) ×3 IMPLANT
PAD CAST 3X4 CTTN HI CHSV (CAST SUPPLIES) IMPLANT
PAD CAST 4YDX4 CTTN HI CHSV (CAST SUPPLIES) ×1 IMPLANT
PADDING CAST ABS 4INX4YD NS (CAST SUPPLIES) ×2
PADDING CAST ABS COTTON 4X4 ST (CAST SUPPLIES) ×1 IMPLANT
PADDING CAST COTTON 3X4 STRL (CAST SUPPLIES)
PADDING CAST COTTON 4X4 STRL (CAST SUPPLIES) ×2
SCREW 1.3X11MM (Screw) ×6 IMPLANT
SCREW BN 11X1.3XNONLOCK HND (Screw) ×3 IMPLANT
SCREW NON-LOCK 1.3X12 (Screw) ×6 IMPLANT
SLEEVE SCD COMPRESS KNEE MED (MISCELLANEOUS) IMPLANT
SPLINT PLASTER CAST XFAST 3X15 (CAST SUPPLIES) IMPLANT
SPLINT PLASTER XTRA FASTSET 3X (CAST SUPPLIES)
STOCKINETTE 4X48 STRL (DRAPES) ×3 IMPLANT
SUT CHROMIC 4 0 RB 1X27 (SUTURE) ×3 IMPLANT
SUT ETHILON 3 0 PS 1 (SUTURE) IMPLANT
SUT ETHILON 4 0 PS 2 18 (SUTURE) ×3 IMPLANT
SUT MERSILENE 4 0 P 3 (SUTURE) IMPLANT
SUT VIC AB 3-0 PS1 18 (SUTURE)
SUT VIC AB 3-0 PS1 18XBRD (SUTURE) IMPLANT
SUT VICRYL 4-0 PS2 18IN ABS (SUTURE) IMPLANT
SYR BULB 3OZ (MISCELLANEOUS) ×3 IMPLANT
SYR CONTROL 10ML LL (SYRINGE) IMPLANT
TOWEL OR 17X24 6PK STRL BLUE (TOWEL DISPOSABLE) ×6 IMPLANT
UNDERPAD 30X30 (UNDERPADS AND DIAPERS) ×3 IMPLANT

## 2016-10-22 NOTE — Brief Op Note (Signed)
10/22/2016  12:14 PM  PATIENT:  Meagan Anderson  26 y.o. female  PRE-OPERATIVE DIAGNOSIS:  SPIRAL OBLIQUE FRACTURE LEFT RING FINGER PROXIMAL PHALANX  POST-OPERATIVE DIAGNOSIS:  SPIRAL OBLIQUE FRACTURE LEFT RING FINGER PROXIMAL PHALANX  PROCEDURE:  Procedure(s): OPEN REDUCTION INTERNAL FIXATION LEFT RING FINGER PROXIMAL PHALANX (Left)  SURGEON:  Surgeon(s) and Role:    * Betha LoaKevin Nataliya Graig, MD - Primary    * Cindee SaltGary Anique Beckley, MD - Assisting  PHYSICIAN ASSISTANT:   ASSISTANTS: Cindee SaltGary Nature Kueker, MD   ANESTHESIA:   general  EBL:  Total I/O In: 1100 [I.V.:1100] Out: 2 [Blood:2]  BLOOD ADMINISTERED:none  DRAINS: none   LOCAL MEDICATIONS USED:  MARCAINE     SPECIMEN:  No Specimen  DISPOSITION OF SPECIMEN:  N/A  COUNTS:  YES  TOURNIQUET:   Total Tourniquet Time Documented: Upper Arm (Left) - 59 minutes Total: Upper Arm (Left) - 59 minutes   DICTATION: .Other Dictation: Dictation Number [id number]  PLAN OF CARE: Discharge to home after PACU  PATIENT DISPOSITION:  PACU - hemodynamically stable.

## 2016-10-22 NOTE — Transfer of Care (Signed)
Immediate Anesthesia Transfer of Care Note  Patient: Meagan Anderson  Procedure(s) Performed: Procedure(s): OPEN REDUCTION INTERNAL FIXATION LEFT RING FINGER PROXIMAL PHALANX (Left)  Patient Location: PACU  Anesthesia Type:General  Level of Consciousness: awake and sedated  Airway & Oxygen Therapy: Patient Spontanous Breathing and Patient connected to face mask oxygen  Post-op Assessment: Report given to RN and Post -op Vital signs reviewed and stable  Post vital signs: Reviewed and stable  Last Vitals:  Vitals:   10/22/16 1012  BP: 131/72  Pulse: 78  Resp: 18  Temp: 36.8 C    Last Pain:  Vitals:   10/22/16 1012  TempSrc: Oral  PainSc: 6       Patients Stated Pain Goal: 2 (10/22/16 1012)  Complications: No apparent anesthesia complications 

## 2016-10-22 NOTE — Anesthesia Preprocedure Evaluation (Addendum)
Anesthesia Evaluation  Patient identified by MRN, date of birth, ID band Patient awake    Reviewed: Allergy & Precautions, H&P , Patient's Chart, lab work & pertinent test results, reviewed documented beta blocker date and time   Airway Mallampati: II  TM Distance: >3 FB Neck ROM: full    Dental no notable dental hx.    Pulmonary    Pulmonary exam normal breath sounds clear to auscultation       Cardiovascular  Rhythm:regular Rate:Normal     Neuro/Psych    GI/Hepatic   Endo/Other    Renal/GU      Musculoskeletal   Abdominal   Peds  Hematology   Anesthesia Other Findings   Reproductive/Obstetrics                             Anesthesia Physical Anesthesia Plan  ASA: II  Anesthesia Plan: General   Post-op Pain Management:    Induction: Intravenous  Airway Management Planned: LMA  Additional Equipment:   Intra-op Plan:   Post-operative Plan:   Informed Consent: I have reviewed the patients History and Physical, chart, labs and discussed the procedure including the risks, benefits and alternatives for the proposed anesthesia with the patient or authorized representative who has indicated his/her understanding and acceptance.   Dental Advisory Given and Dental advisory given  Plan Discussed with: CRNA and Surgeon  Anesthesia Plan Comments: (Discussed GA with LMA, possible sore throat, potential need to switch to ETT, N/V, pulmonary aspiration. Pt prefers no block  Questions answered. )       Anesthesia Quick Evaluation

## 2016-10-22 NOTE — Discharge Instructions (Addendum)

## 2016-10-22 NOTE — Op Note (Signed)
I assisted Surgeon(s) and Role:    * Betha LoaKevin Kalvyn Desa, MD - Primary    * Cindee SaltGary Amontae Ng, MD - Assisting on the Procedure(s): OPEN REDUCTION INTERNAL FIXATION LEFT RING FINGER PROXIMAL PHALANX on 10/22/2016.  I provided assistance on this case as follows: approach, debridement of the fracture , reduction, stabilization, fixation and closure of the incision. I assisted with application of the dressing and splint. I was present for the entire case.  Electronically signed by: Nicki ReaperKUZMA,Jabori Henegar R, MD Date: 10/22/2016 Time: 12:10 PM

## 2016-10-22 NOTE — Op Note (Signed)
[id number] 

## 2016-10-22 NOTE — H&P (Signed)
  Meagan Anderson is an 26 y.o. female.   Chief Complaint: left ring finger fracture HPI: 26 yo female with fracture left ring finger.  XR in ED show oblique fracture of proximal phalanx.  She wishes to have fixation of the fracture.  Allergies: No Known Allergies  Past Medical History:  Diagnosis Date  . Ankle fracture 2009   right ankle  . Herpes   . Seasonal allergies     Past Surgical History:  Procedure Laterality Date  . ANKLE SURGERY Right   . MOUTH SURGERY    . ORIF ANKLE FRACTURE Right 2008   plate and 11 screws    Family History: Family History  Problem Relation Age of Onset  . Hyperlipidemia Maternal Grandmother   . Heart disease Maternal Grandmother     Social History:   reports that she has never smoked. She has never used smokeless tobacco. She reports that she does not drink alcohol or use drugs.  Medications: Medications Prior to Admission  Medication Sig Dispense Refill  . HYDROcodone-acetaminophen (NORCO) 5-325 MG tablet Take 1 tablet by mouth every 6 (six) hours as needed. 15 tablet 0  . naproxen (NAPROSYN) 375 MG tablet Take 1 tablet (375 mg total) by mouth 2 (two) times daily. 20 tablet 0    No results found for this or any previous visit (from the past 48 hour(s)).  No results found.   A comprehensive review of systems was negative.  Blood pressure 131/72, pulse 78, temperature 98.3 F (36.8 C), temperature source Oral, resp. rate 18, height 5\' 6"  (1.676 m), weight 62.2 kg (137 lb 2 oz), last menstrual period 09/29/2016, SpO2 99 %, unknown if currently breastfeeding.  General appearance: alert, cooperative and appears stated age Head: Normocephalic, without obvious abnormality, atraumatic Neck: supple, symmetrical, trachea midline Resp: clear to auscultation bilaterally Cardio: regular rate and rhythm GI: non-tender Extremities: Intact sensation and capillary refill all digits.  +epl/fpl/io.  No wounds.  Pulses: 2+ and symmetric Skin:  Skin color, texture, turgor normal. No rashes or lesions Neurologic: Grossly normal Incision/Wound:none  Assessment/Plan Left ring finger proximal phalanx fracture.  Non operative and operative treatment options were discussed with the patient and patient wishes to proceed with operative treatment. Risks, benefits, and alternatives of surgery were discussed and the patient agrees with the plan of care.   Madissen Wyse R 10/22/2016, 10:31 AM

## 2016-10-22 NOTE — Transfer of Care (Signed)
Immediate Anesthesia Transfer of Care Note  Patient: Meagan Anderson  Procedure(s) Performed: Procedure(s): OPEN REDUCTION INTERNAL FIXATION LEFT RING FINGER PROXIMAL PHALANX (Left)  Patient Location: PACU  Anesthesia Type:General  Level of Consciousness: awake and sedated  Airway & Oxygen Therapy: Patient Spontanous Breathing and Patient connected to face mask oxygen  Post-op Assessment: Report given to RN and Post -op Vital signs reviewed and stable  Post vital signs: Reviewed and stable  Last Vitals:  Vitals:   10/22/16 1012  BP: 131/72  Pulse: 78  Resp: 18  Temp: 36.8 C    Last Pain:  Vitals:   10/22/16 1012  TempSrc: Oral  PainSc: 6       Patients Stated Pain Goal: 2 (10/22/16 1012)  Complications: No apparent anesthesia complications

## 2016-10-22 NOTE — Anesthesia Procedure Notes (Signed)
Procedure Name: LMA Insertion Performed by: Hawa Henly W Pre-anesthesia Checklist: Patient identified, Emergency Drugs available, Suction available and Patient being monitored Patient Re-evaluated:Patient Re-evaluated prior to inductionOxygen Delivery Method: Circle system utilized Preoxygenation: Pre-oxygenation with 100% oxygen Intubation Type: IV induction Ventilation: Mask ventilation without difficulty LMA: LMA inserted LMA Size: 4.0 Number of attempts: 1 Placement Confirmation: positive ETCO2 Tube secured with: Tape Dental Injury: Teeth and Oropharynx as per pre-operative assessment        

## 2016-10-22 NOTE — Anesthesia Postprocedure Evaluation (Signed)
Anesthesia Post Note  Patient: Meagan Anderson  Procedure(s) Performed: Procedure(s) (LRB): OPEN REDUCTION INTERNAL FIXATION LEFT RING FINGER PROXIMAL PHALANX (Left)  Patient location during evaluation: PACU Anesthesia Type: General Level of consciousness: awake Pain management: satisfactory to patient Vital Signs Assessment: post-procedure vital signs reviewed and stable Respiratory status: spontaneous breathing Cardiovascular status: stable Anesthetic complications: no       Last Vitals:  Vitals:   10/22/16 1245 10/22/16 1300  BP: 133/83 134/80  Pulse: 72 76  Resp: (!) 9 12  Temp:      Last Pain:  Vitals:   10/22/16 1235  TempSrc:   PainSc: 8                  Caylea Foronda EDWARD

## 2016-10-23 ENCOUNTER — Encounter (HOSPITAL_BASED_OUTPATIENT_CLINIC_OR_DEPARTMENT_OTHER): Payer: Self-pay | Admitting: Orthopedic Surgery

## 2016-10-23 NOTE — Op Note (Signed)
Meagan Anderson:  [name], Meagan Anderson              ACCOUNT NO.:  [id number]  MEDICAL RECORD NO.:  [id number]  LOCATION:                                 FACILITY:  PHYSICIAN:  Betha LoaKevin Halford Goetzke, MD             DATE OF BIRTH:  DATE OF PROCEDURE:  10/22/2016 DATE OF DISCHARGE:                              OPERATIVE REPORT   PREOPERATIVE DIAGNOSIS:  Left ring finger proximal phalanx fracture.  POSTOPERATIVE DIAGNOSIS:  Left ring finger proximal phalanx fracture.  PROCEDURE:  Open reduction and internal fixation of left ring finger proximal phalanx fracture.  SURGEON:  Betha LoaKevin Daejon Lich, MD.  ASSISTANT:  Cindee SaltGary Chalese Peach, MD.  ANESTHESIA:  General.  IV FLUIDS:  Per anesthesia flow sheet.  ESTIMATED BLOOD LOSS:  Minimal.  COMPLICATIONS:  None.  SPECIMENS:  None.  TOURNIQUET TIME:  59 minutes.  DISPOSITION:  Stable to PACU.  INDICATIONS:  [name] is a 26 year old female who injured her left index finger sustaining a proximal phalanx fracture.  She was seen in the emergency department where radiographs were taken revealing the fracture.  She was splinted and followed up in the office.  We discussed nonoperative and operative treatment options.  I recommended operative fixation.  Risks, benefits, and alternatives of surgery were discussed including the risk of blood loss; infection; damage to nerves, vessels, tendons, ligaments, bone; failure of surgery; need for additional surgery; complications with wound healing; continued pain; nonunion; malunion; stiffness.  She voiced understanding of these risks and elected to proceed.  OPERATIVE COURSE:  After being identified preoperatively by myself, the patient and I agreed upon procedure and site of procedure.  Surgical site was marked.  The risks, benefits, and alternatives of surgery were reviewed and she wished to proceed.  Surgical consent had been signed. She was given IV Ancef as preoperative antibiotic prophylaxis.  She was transported to the  operating room and placed on the operating room table in supine position with the left upper extremity on arm board.  General anesthesia induced by Anesthesiology.  Left upper extremity was prepped and draped in normal sterile orthopedic fashion.  A surgical pause was performed between surgeons, anesthesia, and operating room staff and all were in agreement as to the patient, procedure, and site of procedure. Tourniquet at the proximal aspect of the extremity was inflated to 250 mmHg after exsanguination of the limb with an Esmarch bandage.  Incision was made on dorsum of the finger and carried into subcutaneous tissues by spreading technique.  Bipolar electrocautery was used to obtain hemostasis.  The extensor tendon and periosteum were sharply incised and elevated.  The fracture site was identified.  It was clear of soft tissue interposition and clot.  It was reduced under direct visualization.  There was noted to be a proximal extension of the fracture line.  Once adequate reduction had been obtained, the C-arm was used in AP and lateral projections to ensure appropriate reduction.  The A.L.P.S set was used.  A 1.3 mm screws were used.  Standard AO drilling and measuring technique was used.  Three lag screws were placed across the distal aspect of the fracture and 2 at the proximal  aspect of the fracture.  This was adequate to stabilize the fracture.  C-arm was used in AP and lateral projections to ensure appropriate reduction and position of hardware which was the case.  The wrist was placed through tenodesis and there was no scissoring.  There was good alignment of the ring finger.  The wound was copiously irrigated with sterile saline. The periosteum was repaired with a running chromic suture.  The extensor tendon was repaired with a running Mersilene suture, and the skin was closed with a running subcuticular 5-0 Monocryl suture which was augmented with benzoin and Steri-Strips.  A  digital block was performed with 10 mL of 0.25% plain Marcaine to aid in postoperative analgesia. The wound was then dressed with sterile 4x4s and Kerlix bandage.  A volar splint was placed and wrapped with Kerlix and Ace bandage. Tourniquet was deflated at 59 minutes.  Fingertips were pink with brisk capillary refill after deflation of the tourniquet.  Operative drapes were broken down and the patient was awoken from anesthesia safely.  She was transferred back to a stretcher and taken to PACU in stable condition.  I will see her back in the office in 1 week for postoperative followup.  I will give her Norco 5/325 one to two p.o. q.6 hours p.r.n. pain, dispensed #30.     Betha Loa, MD     KK/MEDQ  D:  10/22/2016  T:  10/23/2016  Job:  [id number]

## 2016-11-23 ENCOUNTER — Emergency Department (HOSPITAL_COMMUNITY): Payer: BLUE CROSS/BLUE SHIELD

## 2016-11-23 ENCOUNTER — Encounter (HOSPITAL_COMMUNITY): Payer: Self-pay | Admitting: Emergency Medicine

## 2016-11-23 ENCOUNTER — Emergency Department (HOSPITAL_COMMUNITY)
Admission: EM | Admit: 2016-11-23 | Discharge: 2016-11-23 | Disposition: A | Discharge status: Home / Self Care | Payer: BLUE CROSS/BLUE SHIELD | Attending: Emergency Medicine | Admitting: Emergency Medicine

## 2016-11-23 DIAGNOSIS — Z79899 Other long term (current) drug therapy: Secondary | ICD-10-CM | POA: Insufficient documentation

## 2016-11-23 DIAGNOSIS — Y929 Unspecified place or not applicable: Secondary | ICD-10-CM | POA: Diagnosis not present

## 2016-11-23 DIAGNOSIS — S43014A Anterior dislocation of right humerus, initial encounter: Secondary | ICD-10-CM

## 2016-11-23 DIAGNOSIS — Y9389 Activity, other specified: Secondary | ICD-10-CM | POA: Insufficient documentation

## 2016-11-23 DIAGNOSIS — Y999 Unspecified external cause status: Secondary | ICD-10-CM | POA: Diagnosis not present

## 2016-11-23 MED ORDER — HYDROMORPHONE HCL 2 MG/ML IJ SOLN
1.0000 mg | Freq: Once | INTRAMUSCULAR | Status: AC
  Administered 2016-11-23: 1 mg via INTRAVENOUS
  Filled 2016-11-23: qty 1

## 2016-11-23 MED ORDER — LIDOCAINE HCL (PF) 2 % IJ SOLN
10.0000 mL | Freq: Once | INTRAMUSCULAR | Status: AC
  Administered 2016-11-23: 10 mL
  Filled 2016-11-23: qty 10

## 2016-11-23 NOTE — ED Notes (Signed)
Papers reviewed with patient and she verbalizes understanding. SLing applied and instructions for use reviewed
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
## 2016-11-23 NOTE — ED Notes (Signed)
Consent at bedside.  

## 2016-11-29 ENCOUNTER — Emergency Department (HOSPITAL_COMMUNITY): Payer: BLUE CROSS/BLUE SHIELD

## 2016-11-29 ENCOUNTER — Emergency Department (HOSPITAL_COMMUNITY)
Admission: EM | Admit: 2016-11-29 | Discharge: 2016-11-29 | Disposition: A | Discharge status: Home / Self Care | Payer: BLUE CROSS/BLUE SHIELD | Attending: Emergency Medicine | Admitting: Emergency Medicine

## 2016-11-29 ENCOUNTER — Encounter (HOSPITAL_COMMUNITY): Payer: Self-pay

## 2016-11-29 DIAGNOSIS — Y929 Unspecified place or not applicable: Secondary | ICD-10-CM | POA: Diagnosis not present

## 2016-11-29 DIAGNOSIS — Y999 Unspecified external cause status: Secondary | ICD-10-CM | POA: Diagnosis not present

## 2016-11-29 DIAGNOSIS — S43014A Anterior dislocation of right humerus, initial encounter: Secondary | ICD-10-CM | POA: Diagnosis not present

## 2016-11-29 DIAGNOSIS — Y939 Activity, unspecified: Secondary | ICD-10-CM | POA: Diagnosis not present

## 2016-11-29 DIAGNOSIS — X509XXA Other and unspecified overexertion or strenuous movements or postures, initial encounter: Secondary | ICD-10-CM | POA: Insufficient documentation

## 2016-11-29 DIAGNOSIS — S43004A Unspecified dislocation of right shoulder joint, initial encounter: Secondary | ICD-10-CM

## 2016-11-29 MED ORDER — HYDROMORPHONE HCL 2 MG/ML IJ SOLN
0.5000 mg | Freq: Once | INTRAMUSCULAR | Status: AC
  Administered 2016-11-29: 0.5 mg via INTRAVENOUS
  Filled 2016-11-29: qty 1

## 2016-11-29 MED ORDER — PROPOFOL 10 MG/ML IV BOLUS
1.0000 mg/kg | Freq: Once | INTRAVENOUS | Status: AC
  Administered 2016-11-29: 60 mg via INTRAVENOUS
  Filled 2016-11-29: qty 20

## 2016-11-29 MED ORDER — HYDROMORPHONE HCL 2 MG/ML IJ SOLN
1.0000 mg | Freq: Once | INTRAMUSCULAR | Status: AC
  Administered 2016-11-29: 1 mg via INTRAVENOUS
  Filled 2016-11-29: qty 1

## 2016-11-29 MED ORDER — IBUPROFEN 800 MG PO TABS: 800.0000 mg | ORAL_TABLET | Freq: Three times a day (TID) | ORAL | 0 refills | Status: AC | PRN

## 2016-11-29 MED ORDER — HYDROCODONE-ACETAMINOPHEN 5-325 MG PO TABS: 1.0000 | ORAL_TABLET | Freq: Four times a day (QID) | ORAL | 0 refills | Status: AC | PRN

## 2016-11-29 MED ORDER — SODIUM CHLORIDE 0.9 % IV BOLUS (SEPSIS)
1000.0000 mL | Freq: Once | INTRAVENOUS | Status: AC
  Administered 2016-11-29: 1000 mL via INTRAVENOUS
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
## 2016-11-29 NOTE — ED Notes (Signed)
EDP at bedside  

## 2016-11-29 NOTE — Sedation Documentation (Signed)
Another 30 mg propofol given per Dr. Elesa MassedWard.
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
## 2016-11-29 NOTE — Sedation Documentation (Signed)
Shoulder has been reduced. Called xray for stat portable.

## 2016-11-29 NOTE — Progress Notes (Signed)
Orthopedic Tech Progress Note Patient Details:  Meagan Anderson 10/23/1990 [id number]  Ortho Devices Type of Ortho Device: Sling immobilizer Ortho Device/Splint Location: rue Ortho Device/Splint Interventions: Ordered, Application   [name], Kianni Lheureux J 11/29/2016, 2:20 AM

## 2016-11-29 NOTE — Discharge Instructions (Signed)
Please follow-up with orthopedics as instructed during your last visit.  Please wear your shoulder immobilizer at all times.
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
